# Patient Record
Sex: Female | Born: 1975 | Race: Black or African American | Hispanic: No | Smoking: Current some day smoker
Health system: Southern US, Community
[De-identification: ages and names within clinical notes are randomized; demographics above are authoritative.]

## PROBLEM LIST (undated history)

## (undated) DIAGNOSIS — J45909 Unspecified asthma, uncomplicated: Secondary | ICD-10-CM

## (undated) HISTORY — PX: TONSILLECTOMY: SUR1361

## (undated) HISTORY — PX: TUBAL LIGATION: SHX77

---

## 2017-02-20 ENCOUNTER — Emergency Department (HOSPITAL_COMMUNITY): Payer: Self-pay

## 2017-02-20 ENCOUNTER — Encounter (HOSPITAL_COMMUNITY): Payer: Self-pay

## 2017-02-20 ENCOUNTER — Emergency Department (HOSPITAL_COMMUNITY)
Admission: EM | Admit: 2017-02-20 | Discharge: 2017-02-20 | Disposition: A | Discharge status: Home / Self Care | Payer: Self-pay | Attending: Emergency Medicine | Admitting: Emergency Medicine

## 2017-02-20 ENCOUNTER — Other Ambulatory Visit: Payer: Self-pay

## 2017-02-20 DIAGNOSIS — R079 Chest pain, unspecified: Secondary | ICD-10-CM | POA: Insufficient documentation

## 2017-02-20 DIAGNOSIS — J45909 Unspecified asthma, uncomplicated: Secondary | ICD-10-CM | POA: Insufficient documentation

## 2017-02-20 DIAGNOSIS — F17210 Nicotine dependence, cigarettes, uncomplicated: Secondary | ICD-10-CM | POA: Insufficient documentation

## 2017-02-20 DIAGNOSIS — Z79899 Other long term (current) drug therapy: Secondary | ICD-10-CM | POA: Insufficient documentation

## 2017-02-20 DIAGNOSIS — R0602 Shortness of breath: Secondary | ICD-10-CM | POA: Insufficient documentation

## 2017-02-20 HISTORY — DX: Unspecified asthma, uncomplicated: J45.909

## 2017-02-20 LAB — I-STAT TROPONIN, ED
TROPONIN I, POC: 0.01 ng/mL (ref 0.00–0.08)
Troponin i, poc: 0 ng/mL (ref 0.00–0.08)

## 2017-02-20 LAB — CBC
HCT: 40.5 % (ref 36.0–46.0)
Hemoglobin: 13.3 g/dL (ref 12.0–15.0)
MCH: 30 pg (ref 26.0–34.0)
MCHC: 32.8 g/dL (ref 30.0–36.0)
MCV: 91.4 fL (ref 78.0–100.0)
PLATELETS: 328 10*3/uL (ref 150–400)
RBC: 4.43 MIL/uL (ref 3.87–5.11)
RDW: 13.1 % (ref 11.5–15.5)
WBC: 8.2 10*3/uL (ref 4.0–10.5)

## 2017-02-20 LAB — BASIC METABOLIC PANEL
Anion gap: 8 (ref 5–15)
BUN: 8 mg/dL (ref 6–20)
CALCIUM: 9.2 mg/dL (ref 8.9–10.3)
CO2: 24 mmol/L (ref 22–32)
CREATININE: 0.8 mg/dL (ref 0.44–1.00)
Chloride: 106 mmol/L (ref 101–111)
Glucose, Bld: 97 mg/dL (ref 65–99)
Potassium: 4 mmol/L (ref 3.5–5.1)
Sodium: 138 mmol/L (ref 135–145)

## 2017-02-20 LAB — I-STAT BETA HCG BLOOD, ED (MC, WL, AP ONLY): I-stat hCG, quantitative: <5 m[IU]/mL (ref <5)

## 2017-02-20 LAB — D-DIMER, QUANTITATIVE: D-Dimer, Quant: 0.66 ug/mL-FEU — ABNORMAL HIGH (ref 0.00–0.50)

## 2017-02-20 LAB — BRAIN NATRIURETIC PEPTIDE: B NATRIURETIC PEPTIDE 5: 18.3 pg/mL (ref 0.0–100.0)

## 2017-02-20 MED ORDER — IOPAMIDOL (ISOVUE-370) INJECTION 76%
INTRAVENOUS | Status: AC
  Administered 2017-02-20: 100 mL
  Filled 2017-02-20: qty 100

## 2017-02-20 MED ORDER — CYCLOBENZAPRINE HCL 10 MG PO TABS: 10.0000 mg | ORAL_TABLET | Freq: Two times a day (BID) | ORAL | 0 refills | Status: DC | PRN

## 2017-02-20 MED ORDER — ASPIRIN 81 MG PO CHEW
324.0000 mg | CHEWABLE_TABLET | Freq: Once | ORAL | Status: AC
  Administered 2017-02-20: 324 mg via ORAL
  Filled 2017-02-20: qty 4

## 2017-02-20 MED ORDER — NITROGLYCERIN 0.4 MG SL SUBL
0.4000 mg | SUBLINGUAL_TABLET | SUBLINGUAL | Status: DC | PRN
  Administered 2017-02-20: 0.4 mg via SUBLINGUAL
  Filled 2017-02-20: qty 1

## 2017-02-20 NOTE — ED Triage Notes (Signed)
Per Pt, Pt is coming from home with complaints of central chest pain that radiates to her back and neck. Denies SOB or N/V. Reports it started two days ago and has continued.

## 2017-02-20 NOTE — ED Notes (Signed)
Pt ambulatory to restroom

## 2017-02-21 NOTE — ED Provider Notes (Signed)
[facility] DEPT Provider Note   CSN: [id number] Arrival date & time: 02/20/17  1017     History   Chief Complaint Chief Complaint  Patient presents with  . Chest Pain    HPI Tamara Cline is a 41 y.o. female.  HPI   41yo female presents with concern for chest pain. Has been going on for the past 3 days. Chest pain is sharp, left sided, radiates to the back. It is worse with deep breaths, arm movements and laying down flat.  Reports she has had dyspnea over the last few days. No nausea, no vomiting, no diaphoresis.  No cough, no fever, no asymmetric leg swelling. Does not see doctor, but has not had diagnosis of htn, hlpd, DM. No fam hx of PE/CAD. No recent surgery. Does smoke cigarettes. Denies other drug use.  Past Medical History:  Diagnosis Date  . Asthma     There are no active problems to display for this patient.   Past Surgical History:  Procedure Laterality Date  . TONSILLECTOMY    . TUBAL LIGATION      OB History    No data available       Home Medications    Prior to Admission medications   Medication Sig Start Date End Date Taking? Authorizing Provider  esomeprazole (NEXIUM) 40 MG capsule Take 40 mg by mouth daily at 12 noon.   Yes [provider]  cyclobenzaprine (FLEXERIL) 10 MG tablet Take 1 tablet (10 mg total) by mouth 2 (two) times daily as needed for muscle spasms. 02/20/17   Gareth Morgan, MD    Family History No family history on file.  Social History Social History  Substance Use Topics  . Smoking status: Current Some Day Smoker    Types: Cigarettes  . Smokeless tobacco: Never Used  . Alcohol use Yes     Comment: Occasionally      Allergies   Patient has no known allergies.   Review of Systems Review of Systems  Constitutional: Negative for fever.  HENT: Negative for sore throat.   Eyes: Negative for visual disturbance.  Respiratory: Positive for shortness of breath. Negative for cough.   Cardiovascular:  Positive for chest pain.  Gastrointestinal: Negative for abdominal pain, diarrhea, nausea and vomiting.  Genitourinary: Negative for difficulty urinating.  Musculoskeletal: Negative for back pain and neck pain.  Skin: Negative for rash.  Neurological: Negative for syncope and headaches.     Physical Exam Updated Vital Signs BP 120/89   Pulse 73   Temp 98.7 F (37.1 C) (Oral)   Resp (!) 29   Ht 5\' 3"  (1.6 m)   Wt 117.9 kg (260 lb)   LMP 02/15/2017 (Exact Date)   SpO2 100%   BMI 46.06 kg/m   Physical Exam  Constitutional: She is oriented to person, place, and time. She appears well-developed and well-nourished. No distress.  HENT:  Head: Normocephalic and atraumatic.  Eyes: Conjunctivae and EOM are normal.  Neck: Normal range of motion.  Cardiovascular: Normal rate, regular rhythm, normal heart sounds and intact distal pulses.  Exam reveals no gallop and no friction rub.   No murmur heard. Pulmonary/Chest: Effort normal and breath sounds normal. No respiratory distress. She has no wheezes. She has no rales.  Abdominal: Soft. She exhibits no distension. There is no tenderness. There is no guarding.  Musculoskeletal: She exhibits no edema or tenderness.  Neurological: She is alert and oriented to person, place, and time.  Skin: Skin is warm and  dry. No rash noted. She is not diaphoretic. No erythema.  Nursing note and vitals reviewed.    ED Treatments / Results  Labs (all labs ordered are listed, but only abnormal results are displayed) Labs Reviewed  D-DIMER, QUANTITATIVE (NOT AT [facility]) - Abnormal; Notable for the following:       Result Value   D-Dimer, Quant 0.66 (*)    All other components within normal limits  BASIC METABOLIC PANEL  CBC  BRAIN NATRIURETIC PEPTIDE  I-STAT TROPOININ, ED  I-STAT BETA HCG BLOOD, ED ([facility], [facility], [facility] ONLY)  I-STAT TROPOININ, ED    EKG  EKG Interpretation  Date/Time:  Tuesday February 20 2017 10:21:43 EDT Ventricular Rate:  93 PR  Interval:  144 QRS Duration: 84 QT Interval:  368 QTC Calculation: 457 R Axis:   47 Text Interpretation:  Normal sinus rhythm Normal ECG Nonspecific TW changes No previous ECGs available Confirmed by Gareth Morgan [phone]) on 02/20/2017 12:41:40 PM       Radiology Dg Chest 2 View  Result Date: 02/20/2017 CLINICAL DATA:  Chest pain and cough EXAM: CHEST  2 VIEW COMPARISON:  None. FINDINGS: Lungs are clear. Heart size and pulmonary vascularity are normal. No adenopathy. No evident bone lesions. No pneumothorax. IMPRESSION: No edema or consolidation. Electronically Signed   By: Lowella Grip III M.D.   On: 02/20/2017 11:11   Ct Angio Chest Pe W And/or Wo Contrast  Result Date: 02/20/2017 CLINICAL DATA:  Mid chest pain for 3 days EXAM: CT ANGIOGRAPHY CHEST WITH CONTRAST TECHNIQUE: Multidetector CT imaging of the chest was performed using the standard protocol during bolus administration of intravenous contrast. Multiplanar CT image reconstructions and MIPs were obtained to evaluate the vascular anatomy. CONTRAST:  100 mL Isovue 370 COMPARISON:  None. FINDINGS: Cardiovascular: Satisfactory opacification of the pulmonary arteries to the segmental level. No evidence of pulmonary embolism. Normal heart size. No pericardial effusion. Thoracic aorta is normal in caliber. No thoracic aortic dissection. Mediastinum/Nodes: No enlarged mediastinal, hilar, or axillary lymph nodes. Thyroid gland, trachea, and esophagus demonstrate no significant findings. Lungs/Pleura: Lungs are clear. No pleural effusion or pneumothorax. Upper Abdomen: No acute abnormality. Musculoskeletal: No chest wall abnormality. No acute or significant osseous findings. Review of the MIP images confirms the above findings. IMPRESSION: 1. No pulmonary embolus. 2. No thoracic aortic aneurysm or dissection. 3. No acute cardiopulmonary disease. Electronically Signed   By: Kathreen Devoid   On: 02/20/2017 15:22    Procedures Procedures  (including critical care time)  Medications Ordered in ED Medications  iopamidol (ISOVUE-370) 76 % injection (100 mLs  Contrast Given 02/20/17 1511)  aspirin chewable tablet 324 mg (324 mg Oral Given 02/20/17 1530)     Initial Impression / Assessment and Plan / ED Course  I have reviewed the triage vital signs and the nursing notes.  Pertinent labs & imaging results that were available during my care of the patient were reviewed by me and considered in my medical decision making (see chart for details).     41yo female with history of smoking and asthma presents with concern for chest pain.  Differential diagnosis for chest pain includes pulmonary embolus, dissection, pneumothorax, pneumonia, ACS, myocarditis, pericarditis.  EKG was done and evaluate by me and showed no acute ST changes and no signs of pericarditis. Chest x-ray was done and evaluated by me and radiology and showed no sign of pneumonia or pneumothorax. DDimer pos, CTA PE study ordered showing no abnormalities. Patient is low risk HEART score  and had delta troponins which were both negative. Given this evaluation, history and physical have low suspicion for pulmonary embolus, pneumonia, ACS, myocarditis, pericarditis, dissection.   Patient most likely with musculoskeletal chest pain given pain with palpation as well as her movements. Given rx for flexeril for pain in the ED. Placed order for case management to follow up and assist in pt finding PCP. Patient discharged in stable condition with understanding of reasons to return and recommend PCP follow up.   Final Clinical Impressions(s) / ED Diagnoses   Final diagnoses:  Chest pain, unspecified type    New Prescriptions Discharge Medication List as of 02/20/2017  4:26 PM    START taking these medications   Details  cyclobenzaprine (FLEXERIL) 10 MG tablet Take 1 tablet (10 mg total) by mouth 2 (two) times daily as needed for muscle spasms., Starting Tue 02/20/2017, Print           Gareth Morgan, MD 02/21/17 1326

## 2018-07-15 ENCOUNTER — Ambulatory Visit (HOSPITAL_COMMUNITY)
Admission: EM | Admit: 2018-07-15 | Discharge: 2018-07-15 | Disposition: A | Discharge status: Home / Self Care | Payer: Self-pay | Attending: Family Medicine | Admitting: Family Medicine

## 2018-07-15 ENCOUNTER — Ambulatory Visit (INDEPENDENT_AMBULATORY_CARE_PROVIDER_SITE_OTHER): Payer: Self-pay

## 2018-07-15 ENCOUNTER — Encounter (HOSPITAL_COMMUNITY): Payer: Self-pay

## 2018-07-15 ENCOUNTER — Other Ambulatory Visit: Payer: Self-pay

## 2018-07-15 DIAGNOSIS — M25571 Pain in right ankle and joints of right foot: Secondary | ICD-10-CM

## 2018-07-15 DIAGNOSIS — M79671 Pain in right foot: Secondary | ICD-10-CM

## 2018-07-15 MED ORDER — NAPROXEN 500 MG PO TABS: 500.0000 mg | ORAL_TABLET | Freq: Two times a day (BID) | ORAL | 0 refills | Status: DC

## 2018-07-15 NOTE — ED Provider Notes (Signed)
[facility]   [id number] 07/15/18 Arrival Time: 8250  CC: RT foot pain  SUBJECTIVE: History from: patient. Tamara Cline is a 42 y.o. female complains of right ankle and foot pain that began a couple days ago.  Symtpoms began after she slipped and fell walking down a steep hill.  Is unsure of the mechanism of her fall, does not recall hitting her foot or twisting her ankle.  Localizes the pain to the front of ankle and top of foot.  Describes the pain as constant and sharp, dull, and achy in character.  Has NOT tried OTC medications.  Symptoms are made worse with weight-bearing and walking.  Reports previous fracture in 2008.  Complains of associated numbness and tingling in toes.  Denies fever, chills, erythema, ecchymosis, effusion, or weakness.  ROS: As per HPI.  Past Medical History:  Diagnosis Date  . Asthma    Past Surgical History:  Procedure Laterality Date  . TONSILLECTOMY    . TUBAL LIGATION     No Known Allergies No current facility-administered medications on file prior to encounter.    Current Outpatient Medications on File Prior to Encounter  Medication Sig Dispense Refill  . esomeprazole (NEXIUM) 40 MG capsule Take 40 mg by mouth daily at 12 noon.     Social History   Socioeconomic History  . Marital status: Divorced    Spouse name: Not on file  . Number of children: Not on file  . Years of education: Not on file  . Highest education level: Not on file  Occupational History  . Not on file  Social Needs  . Financial resource strain: Not on file  . Food insecurity:    Worry: Not on file    Inability: Not on file  . Transportation needs:    Medical: Not on file    Non-medical: Not on file  Tobacco Use  . Smoking status: Current Some Day Smoker    Types: Cigarettes  . Smokeless tobacco: Never Used  Substance and Sexual Activity  . Alcohol use: Yes    Comment: Occasionally   . Drug use: Yes    Types: Cocaine    Comment: Reports using  Cocaine 3 days ago  . Sexual activity: Not on file  Lifestyle  . Physical activity:    Days per week: Not on file    Minutes per session: Not on file  . Stress: Not on file  Relationships  . Social connections:    Talks on phone: Not on file    Gets together: Not on file    Attends religious service: Not on file    Active member of club or organization: Not on file    Attends meetings of clubs or organizations: Not on file    Relationship status: Not on file  . Intimate partner violence:    Fear of current or ex partner: Not on file    Emotionally abused: Not on file    Physically abused: Not on file    Forced sexual activity: Not on file  Other Topics Concern  . Not on file  Social History Narrative  . Not on file   History reviewed. No pertinent family history.  OBJECTIVE:  Vitals:   07/15/18 1609 07/15/18 1612  BP:  138/81  Resp:  18  Temp:  98.2 F (36.8 C)  SpO2:  100%  Weight: (!) 310 lb (140.6 kg)     General appearance: Alert; in no acute distress.  Head: NCAT Lungs:  CTA bilaterally Heart: RRR.  Clear S1 and S2 without murmur, gallops, or rubs.  Radial pulses 2+ bilaterally. Musculoskeletal: RT foot/ ankle Inspection: Skin warm, dry, clear and intact without obvious erythema, or ecchymosis. Mild swelling about the ankle Palpation: Diffusely tender about the anterior ankle, and dorsal aspect of the metatarsals ROM: FROM active and passive Strength: 5/5 dorsiflexion, 5/5 plantar flexion Skin: warm and dry Neurologic: Ambulates with mild difficulty; Sensation intact about the upper/ lower extremities Psychological: alert and cooperative; normal mood and affect  DIAGNOSTIC STUDIES:  Dg Ankle Complete Right  Result Date: 07/15/2018 CLINICAL DATA:  Fall with proximal foot pain EXAM: RIGHT ANKLE - COMPLETE 3+ VIEW COMPARISON:  None. FINDINGS: No acute displaced fracture or malalignment. Small plantar calcaneal spur. Ankle mortise is symmetric. IMPRESSION: No  acute osseous abnormality. Electronically Signed   By: Donavan Foil M.D.   On: 07/15/2018 18:13   Dg Foot Complete Right  Result Date: 07/15/2018 CLINICAL DATA:  Fall with foot pain EXAM: RIGHT FOOT COMPLETE - 3+ VIEW COMPARISON:  None. FINDINGS: No fracture or malalignment.  Small plantar calcaneal spur. IMPRESSION: No acute osseous abnormality. Electronically Signed   By: Donavan Foil M.D.   On: 07/15/2018 18:14   ASSESSMENT & PLAN:  1. Right foot pain   2. Acute right ankle pain     Meds ordered this encounter  Medications  . naproxen (NAPROSYN) 500 MG tablet    Sig: Take 1 tablet (500 mg total) by mouth 2 (two) times daily.    Dispense:  30 tablet    Refill:  0    Order Specific Question:   Supervising Provider    Answer:   Raylene Everts [[id number]]   X-rays did not show fracture or dislocation Pt would like to try cam walker Symptoms most likely related to musculoskeletal injury Continue conservative management of rest, ice, elevation, and gentle stretches Take naproxen as needed for pain relief (may cause abdominal discomfort, ulcers, and GI bleeds avoid taking with other NSAIDs) Follow up with orthopedist if symptoms persist Return or go to the ER if you have any new or worsening symptoms (fever, chills, chest pain, abdominal pain, changes in bowel or bladder habits, pain radiating into lower legs, etc...)   Reviewed expectations re: course of current medical issues. Questions answered. Outlined signs and symptoms indicating need for more acute intervention. Patient verbalized understanding. After Visit Summary given.    Lestine Box, PA-C 07/15/18 5621

## 2018-07-15 NOTE — ED Triage Notes (Signed)
Pt cc right foot pain she broke it 2008 and but Saturday she fell going  down a hill.

## 2018-07-15 NOTE — Discharge Instructions (Signed)
X-rays did not show fracture or dislocation Symptoms most likely related to musculoskeletal injury Continue conservative management of rest, ice, elevation, and gentle stretches Take naproxen as needed for pain relief (may cause abdominal discomfort, ulcers, and GI bleeds avoid taking with other NSAIDs) Follow up with orthopedist if symptoms persist Return or go to the ER if you have any new or worsening symptoms (fever, chills, chest pain, abdominal pain, changes in bowel or bladder habits, pain radiating into lower legs, etc...)

## 2018-11-01 ENCOUNTER — Other Ambulatory Visit: Payer: Self-pay

## 2018-11-01 ENCOUNTER — Ambulatory Visit (HOSPITAL_COMMUNITY)
Admission: EM | Admit: 2018-11-01 | Discharge: 2018-11-01 | Disposition: A | Discharge status: Home / Self Care | Payer: Self-pay | Attending: Emergency Medicine | Admitting: Emergency Medicine

## 2018-11-01 ENCOUNTER — Encounter (HOSPITAL_COMMUNITY): Payer: Self-pay | Admitting: Emergency Medicine

## 2018-11-01 DIAGNOSIS — M79671 Pain in right foot: Secondary | ICD-10-CM

## 2018-11-01 DIAGNOSIS — M722 Plantar fascial fibromatosis: Secondary | ICD-10-CM

## 2018-11-01 MED ORDER — KETOROLAC TROMETHAMINE 60 MG/2ML IM SOLN
INTRAMUSCULAR | Status: AC
  Filled 2018-11-01: qty 2

## 2018-11-01 MED ORDER — MELOXICAM 7.5 MG PO TABS: 7.5000 mg | ORAL_TABLET | Freq: Every day | ORAL | 0 refills | Status: DC

## 2018-11-01 MED ORDER — KETOROLAC TROMETHAMINE 60 MG/2ML IM SOLN
60.0000 mg | Freq: Once | INTRAMUSCULAR | Status: AC
  Administered 2018-11-01: 60 mg via INTRAMUSCULAR

## 2018-11-01 NOTE — Discharge Instructions (Signed)
Ice, elevation, daily meloxicam to help with pain. Start tomorrow.  May use the shoe we have provided but ultimately you need a strong arch support and heel support shoe. Stretching of the foot with a tennis ball can be helpful.  I would recommend following up with podiatry as you may need physical therapy, splinting, or further treatments if symptoms persist.

## 2018-11-01 NOTE — ED Triage Notes (Signed)
Pt reports right heel pain near her arch that started yesterday after she was running on the sidewalk trying to catch the bus.  Pt states when she woke up this morning, she could not even walk to the bathroom.  She reports that she has pain everyday while standing at work, but this pain is worse than usual.

## 2018-11-01 NOTE — ED Provider Notes (Signed)
[facility]    CSN: [id number] Arrival date & time: 11/01/18  1519     History   Chief Complaint Chief Complaint  Patient presents with  . Foot Pain    right    HPI Tamara Cline is a 43 y.o. female.   [name] presents today with complaints of 10/10 pain to bottom of right foot. 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 Has had pain for a long time but worse yesterday. Usually improves once she is off of her feet. She wears Dr. Zoe Lan shoes with insoles for support. This morning when she woke she had to crawl to the restroom due to the severity of the pain. No specific injury. Hasn't taken any medications for symptoms. Pain even at rest but worse with weight bearing and with toe extension. Has had a fibular fracture in the past. No obvious redness or swelling but feels swollen. Hx of asthma.     ROS per HPI, negative if not otherwise mentioned.      Past Medical History:  Diagnosis Date  . Asthma     There are no active problems to display for this patient.   Past Surgical History:  Procedure Laterality Date  . TONSILLECTOMY    . TUBAL LIGATION      OB History   No obstetric history on file.      Home Medications    Prior to Admission medications   Medication Sig Start Date End Date Taking? Authorizing Provider  esomeprazole (NEXIUM) 40 MG capsule Take 40 mg by mouth daily at 12 noon.    [provider]  meloxicam (MOBIC) 7.5 MG tablet Take 1 tablet (7.5 mg total) by mouth daily. 11/01/18   Zigmund Gottron, NP    Family History History reviewed. No pertinent family history.  Social History Social History   Tobacco Use  . Smoking status: Current Some Day Smoker    Types: Cigarettes  . Smokeless tobacco: Never Used  Substance Use Topics  . Alcohol use: Yes    Comment: Occasionally   . Drug use: Yes    Types: Cocaine    Comment: Reports using Cocaine 3 days ago     Allergies   Patient has no known allergies.    Review of Systems Review of Systems   Physical Exam Triage Vital Signs ED Triage Vitals  Enc Vitals Group     BP 11/01/18 1556 (!) 146/97     Pulse Rate 11/01/18 1556 82     Resp --      Temp 11/01/18 1556 98 F (36.7 C)     Temp Source 11/01/18 1556 Oral     SpO2 11/01/18 1556 98 %     Weight --      Height --      Head Circumference --      Peak Flow --      Pain Score 11/01/18 1554 10     Pain Loc --      Pain Edu? --      Excl. in [place]? --    No data found.  Updated Vital Signs BP (!) 146/97 (BP Location: Left Wrist)   Pulse 82   Temp 98 F (36.7 C) (Oral)   LMP 11/01/2018 (Exact Date)   SpO2 98%    Physical Exam Constitutional:      General: She is not in acute distress.    Appearance: She is well-developed.  Cardiovascular:     Rate  and Rhythm: Normal rate and regular rhythm.     Heart sounds: Normal heart sounds.  Pulmonary:     Effort: Pulmonary effort is normal.     Breath sounds: Normal breath sounds.  Musculoskeletal:     Right ankle: Normal.     Right foot: Normal range of motion and normal capillary refill. Tenderness and bony tenderness present. No swelling, crepitus, deformity or laceration.       Feet:     Comments: Tenderness to plantar fascia with palpation, worse with palpation and dorsiflexion; strong pedal pulse; cap refill < 2 seconds    Skin:    General: Skin is warm and dry.  Neurological:     Mental Status: She is alert and oriented to person, place, and time.      UC Treatments / Results  Labs (all labs ordered are listed, but only abnormal results are displayed) Labs Reviewed - No data to display  EKG None  Radiology No results found.  Procedures Procedures (including critical care time)  Medications Ordered in UC Medications  ketorolac (TORADOL) injection 60 mg (60 mg Intramuscular Given 11/01/18 1659)    Initial Impression / Assessment and Plan / UC Course  I have reviewed the triage vital signs and the nursing  notes.  Pertinent labs & imaging results that were available during my care of the patient were reviewed by me and considered in my medical decision making (see chart for details).     Consistent with plantar fasciitis. Patient tearful due to pain. Supportive cares recommended and discussed, encouraged follow up with podiatry. Patient verbalized understanding and agreeable to plan.   Final Clinical Impressions(s) / UC Diagnoses   Final diagnoses:  Plantar fasciitis of right foot     Discharge Instructions     Ice, elevation, daily meloxicam to help with pain. Start tomorrow.  May use the shoe we have provided but ultimately you need a strong arch support and heel support shoe. Stretching of the foot with a tennis ball can be helpful.  I would recommend following up with podiatry as you may need physical therapy, splinting, or further treatments if symptoms persist.      ED Prescriptions    Medication Sig Dispense Auth. Provider   meloxicam (MOBIC) 7.5 MG tablet Take 1 tablet (7.5 mg total) by mouth daily. 20 tablet Zigmund Gottron, NP     Controlled Substance Prescriptions [place] Controlled Substance Registry consulted? Not Applicable   Zigmund Gottron, NP 11/01/18 1705

## 2019-07-17 ENCOUNTER — Encounter (HOSPITAL_COMMUNITY): Payer: Self-pay | Admitting: Emergency Medicine

## 2019-07-17 ENCOUNTER — Ambulatory Visit (HOSPITAL_COMMUNITY)
Admission: EM | Admit: 2019-07-17 | Discharge: 2019-07-17 | Disposition: A | Discharge status: Home / Self Care | Payer: Self-pay | Attending: Family Medicine | Admitting: Family Medicine

## 2019-07-17 ENCOUNTER — Other Ambulatory Visit: Payer: Self-pay

## 2019-07-17 DIAGNOSIS — B9789 Other viral agents as the cause of diseases classified elsewhere: Secondary | ICD-10-CM

## 2019-07-17 DIAGNOSIS — J45909 Unspecified asthma, uncomplicated: Secondary | ICD-10-CM | POA: Insufficient documentation

## 2019-07-17 DIAGNOSIS — R05 Cough: Secondary | ICD-10-CM

## 2019-07-17 DIAGNOSIS — Z20828 Contact with and (suspected) exposure to other viral communicable diseases: Secondary | ICD-10-CM | POA: Insufficient documentation

## 2019-07-17 DIAGNOSIS — J069 Acute upper respiratory infection, unspecified: Secondary | ICD-10-CM | POA: Insufficient documentation

## 2019-07-17 DIAGNOSIS — F17210 Nicotine dependence, cigarettes, uncomplicated: Secondary | ICD-10-CM | POA: Insufficient documentation

## 2019-07-17 DIAGNOSIS — J029 Acute pharyngitis, unspecified: Secondary | ICD-10-CM

## 2019-07-17 MED ORDER — BENZONATATE 100 MG PO CAPS: 100.0000 mg | ORAL_CAPSULE | Freq: Three times a day (TID) | ORAL | 0 refills | Status: DC

## 2019-07-17 NOTE — Discharge Instructions (Addendum)
If your Covid-19 test is positive, you will receive a phone call from [facility] regarding your results. Negative test results are not called. Both positive and negative results area always visible on MyChart. If you do not have a MyChart account, sign up instructions are in your discharge papers.  

## 2019-07-17 NOTE — ED Triage Notes (Signed)
Pt c/o cold sx onset 3 days associated w/nasal drainage, chest discomfort d/t cough, SOB, dyspnea  Sent home from work   Denies fevers  A&O x4... NAD.Marland Kitchen. ambulatory

## 2019-07-19 LAB — NOVEL CORONAVIRUS, NAA (HOSP ORDER, SEND-OUT TO REF LAB; TAT 18-24 HRS): SARS-CoV-2, NAA: NOT DETECTED

## 2019-07-21 ENCOUNTER — Telehealth (HOSPITAL_COMMUNITY): Payer: Self-pay | Admitting: Emergency Medicine

## 2019-07-21 NOTE — Telephone Encounter (Signed)
Verified patient with 2 identifiers.  Reported test results as negative.  Instructed patient that she can obtain a copy of results from her my chart account.  Otherwise, patient can come to [facility] during open hours and get a copy of lab results.  Patient states understanding

## 2019-07-22 NOTE — ED Provider Notes (Signed)
[place]   [id number] 07/17/19 Arrival Time: [id number]  ASSESSMENT & PLAN:  1. Viral URI with cough     COVID testing sent. To self-quarantine until results are available and until she is feeling better. No indication for chest imaging at this time.  Meds ordered this encounter  Medications  . benzonatate (TESSALON) 100 MG capsule    Sig: Take 1 capsule (100 mg total) by mouth every 8 (eight) hours.    Dispense:  21 capsule    Refill:  0    Discussed typical duration of symptoms. OTC symptom care as needed. Ensure adequate fluid intake and rest. May f/u with PCP or here as needed.  Reviewed expectations re: course of current medical issues. Questions answered. Outlined signs and symptoms indicating need for more acute intervention. Patient verbalized understanding. After Visit Summary given.   SUBJECTIVE: History from: patient.  Tamara Cline is a 43 y.o. female who presents with complaint of mild nasal congestion and dry cough; without sore throat. Onset abrupt, about 3 days ago; without fatigue and without body aches. SOB: questions mild when coughing; none at rest. Wheezing: none. Fever: absent. Overall normal PO intake without n/v. Known sick contacts or COVID-19 exposure: no. No specific or significant aggravating or alleviating factors reported. OTC treatment: none reported.  Received flu shot this year: no.  Social History   Tobacco Use  Smoking Status Current Some Day Smoker  . Types: Cigarettes  Smokeless Tobacco Never Used    ROS: As per HPI.   OBJECTIVE:  Vitals:   07/17/19 1741  BP: 134/69  Pulse: 86  Resp: 20  Temp: 98.5 F (36.9 C)  TempSrc: Oral  SpO2: 98%     General appearance: alert; appears fatigued HEENT: nasal congestion; clear runny nose; throat irritation secondary to post-nasal drainage Neck: supple without LAD CV: RRR Lungs: unlabored respirations, symmetrical air entry without wheezing; cough: mild, dry Abd:  soft Ext: no LE edema Skin: warm and dry Psychological: alert and cooperative; normal mood and affect    No Known Allergies  Past Medical History:  Diagnosis Date  . Asthma     Social History   Socioeconomic History  . Marital status: Divorced    Spouse name: Not on file  . Number of children: Not on file  . Years of education: Not on file  . Highest education level: Not on file  Occupational History  . Not on file  Tobacco Use  . Smoking status: Current Some Day Smoker    Types: Cigarettes  . Smokeless tobacco: Never Used  Substance and Sexual Activity  . Alcohol use: Yes    Comment: Occasionally   . Drug use: Yes    Types: Cocaine    Comment: Reports using Cocaine 3 days ago  . Sexual activity: Not on file  Other Topics Concern  . Not on file  Social History Narrative  . Not on file   Social Determinants of Health   Financial Resource Strain:   . Difficulty of Paying Living Expenses: Not on file  Food Insecurity:   . Worried About Charity fundraiser in the Last Year: Not on file  . Ran Out of Food in the Last Year: Not on file  Transportation Needs:   . Lack of Transportation (Medical): Not on file  . Lack of Transportation (Non-Medical): Not on file  Physical Activity:   . Days of Exercise per Week: Not on file  . Minutes of Exercise per Session: Not on  file  Stress:   . Feeling of Stress : Not on file  Social Connections:   . Frequency of Communication with Friends and Family: Not on file  . Frequency of Social Gatherings with Friends and Family: Not on file  . Attends Religious Services: Not on file  . Active Member of Clubs or Organizations: Not on file  . Attends Archivist Meetings: Not on file  . Marital Status: Not on file  Intimate Partner Violence:   . Fear of Current or Ex-Partner: Not on file  . Emotionally Abused: Not on file  . Physically Abused: Not on file  . Sexually Abused: Not on file           Vanessa Kick,  MD 07/22/19 [phone]

## 2020-01-15 ENCOUNTER — Other Ambulatory Visit: Payer: Self-pay

## 2020-01-15 ENCOUNTER — Ambulatory Visit (HOSPITAL_COMMUNITY)
Admission: EM | Admit: 2020-01-15 | Discharge: 2020-01-15 | Disposition: A | Discharge status: Home / Self Care | Payer: Self-pay | Attending: Physician Assistant | Admitting: Physician Assistant

## 2020-01-15 ENCOUNTER — Encounter (HOSPITAL_COMMUNITY): Payer: Self-pay

## 2020-01-15 DIAGNOSIS — R21 Rash and other nonspecific skin eruption: Secondary | ICD-10-CM

## 2020-01-15 MED ORDER — METHYLPREDNISOLONE SODIUM SUCC 125 MG IJ SOLR
INTRAMUSCULAR | Status: AC
  Filled 2020-01-15: qty 2

## 2020-01-15 MED ORDER — METHYLPREDNISOLONE SODIUM SUCC 125 MG IJ SOLR
125.0000 mg | Freq: Once | INTRAMUSCULAR | Status: AC
  Administered 2020-01-15: 125 mg via INTRAMUSCULAR

## 2020-01-15 MED ORDER — PREDNISONE 10 MG (21) PO TBPK: 40.0000 mg | ORAL_TABLET | Freq: Every day | ORAL | 0 refills | Status: AC

## 2020-01-15 NOTE — ED Triage Notes (Signed)
Pt is here with a rash that appeared Sunday, pt states it itches and hurts. This rash is all over, pt has taken Benadryl to relieve discomfort.

## 2020-01-15 NOTE — Discharge Instructions (Signed)
Continue benadryl at night  Take Over the counter zyrtec during the day  Use topical calamine lotion on areas that itch  If you can afford to fill the prednisone, do this and start taking this on Saturday. If you have significant improvement following treatment today, fill the prednisone  If no improvement over the weekend, return

## 2020-01-15 NOTE — ED Provider Notes (Signed)
[facility]    CSN: [id number] Arrival date & time: 01/15/20  1214      History   Chief Complaint Chief Complaint  Patient presents with  . Rash    HPI Tamara Cline is a 44 y.o. female.   Patient presents for evaluation of itchy rash.  She reports she first noticed the rash about 5 days ago.  She first noticed it on her right elbow pit.  Since then the rash is spread on her arm, to her neck, parts of her back and on her forehead.  She reports it is very itchy.  She reports some parts of become painful from itching.  She has tried Benadryl with some relief however this has not relieved the rash enough.  She has not tried anything topically.  She is unsure what she was exposed to.  Denies any garden work or weed pulling or exposure to any plants.  Denies any new lotions, soaps.  No one has a similar rash.  She has not had any known allergies.  Denies difficulty breathing.  Patient states she does not have much money to pay for medicines and is asking for treatment here as opposed to the pharmacy.  She states she is not from the area and does not have much help in the area.     Past Medical History:  Diagnosis Date  . Asthma     There are no problems to display for this patient.   Past Surgical History:  Procedure Laterality Date  . TONSILLECTOMY    . TUBAL LIGATION      OB History   No obstetric history on file.      Home Medications    Prior to Admission medications   Medication Sig Start Date End Date Taking? Authorizing Provider  benzonatate (TESSALON) 100 MG capsule Take 1 capsule (100 mg total) by mouth every 8 (eight) hours. 07/17/19   Vanessa Kick, MD  esomeprazole (NEXIUM) 40 MG capsule Take 40 mg by mouth daily at 12 noon.    [provider]  meloxicam (MOBIC) 7.5 MG tablet Take 1 tablet (7.5 mg total) by mouth daily. 11/01/18   Zigmund Gottron, NP  predniSONE (STERAPRED UNI-PAK 21 TAB) 10 MG (21) TBPK tablet Take 4 tablets (40 mg  total) by mouth daily for 5 days. 01/15/20 01/20/20  Less [place], Marguerita Beards, PA-C    Family History Family History  Problem Relation Age of Onset  . Diabetes Mother   . Hypertension Mother   . Diabetes Father   . Hypertension Father     Social History Social History   Tobacco Use  . Smoking status: Current Some Day Smoker    Types: Cigarettes  . Smokeless tobacco: Never Used  Substance Use Topics  . Alcohol use: Yes    Comment: Occasionally   . Drug use: Yes    Types: Cocaine    Comment: Reports using Cocaine 3 days ago      Allergies   Patient has no known allergies.   Review of Systems Review of Systems   Physical Exam Triage Vital Signs ED Triage Vitals  Enc Vitals Group     BP 01/15/20 1310 131/90     Pulse Rate 01/15/20 1310 99     Resp 01/15/20 1310 19     Temp 01/15/20 1310 98.7 F (37.1 C)     Temp Source 01/15/20 1310 Oral     SpO2 01/15/20 1310 99 %     Weight --  Height --      Head Circumference --      Peak Flow --      Pain Score 01/15/20 1306 5     Pain Loc --      Pain Edu? --      Excl. in [place]? --    No data found.  Updated Vital Signs BP 131/90 (BP Location: Right Arm)   Pulse 99   Temp 98.7 F (37.1 C) (Oral)   Resp 19   LMP 12/31/2019   SpO2 99%   Visual Acuity Right Eye Distance:   Left Eye Distance:   Bilateral Distance:    Right Eye Near:   Left Eye Near:    Bilateral Near:     Physical Exam Vitals and nursing note reviewed.  Constitutional:      General: She is not in acute distress.    Appearance: She is well-developed.  HENT:     Head: Normocephalic and atraumatic.  Eyes:     Conjunctiva/sclera: Conjunctivae normal.  Cardiovascular:     Rate and Rhythm: Normal rate and regular rhythm.     Heart sounds: No murmur heard.   Pulmonary:     Effort: Pulmonary effort is normal. No respiratory distress.     Breath sounds: Normal breath sounds.  Abdominal:     Palpations: Abdomen is soft.     Tenderness: There is  no abdominal tenderness.  Musculoskeletal:     Cervical back: Neck supple.  Skin:    General: Skin is warm and dry.     Findings: Rash (Erythematous, papular and occasionally vesicular rash on the right anterior elbow flexural surface, right forearm, left upper forehead and bilateral flanks.  There is some dermatographia.  No pustules) present.  Neurological:     Mental Status: She is alert.      UC Treatments / Results  Labs (all labs ordered are listed, but only abnormal results are displayed) Labs Reviewed - No data to display  EKG   Radiology No results found.  Procedures Procedures (including critical care time)  Medications Ordered in UC Medications  methylPREDNISolone sodium succinate (SOLU-MEDROL) 125 mg/2 mL injection 125 mg (125 mg Intramuscular Given 01/15/20 1354)    Initial Impression / Assessment and Plan / UC Course  I have reviewed the triage vital signs and the nursing notes.  Pertinent labs & imaging results that were available during my care of the patient were reviewed by me and considered in my medical decision making (see chart for details).     #Rash Patient is a 44 year old presenting with a rash that appears consistent with contact dermatitis.  Given patient has limited resources to pay for prescription medicines we will give her Solu-Medrol here with a prednisone prescription to be filled if possible.  Recommended continued use of Benadryl at night and to use Zyrtec during the day.  Return and follow-up precautions were discussed.  Patient verbalized understanding Final Clinical Impressions(s) / UC Diagnoses   Final diagnoses:  Rash and nonspecific skin eruption     Discharge Instructions     Continue benadryl at night  Take Over the counter zyrtec during the day  Use topical calamine lotion on areas that itch  If you can afford to fill the prednisone, do this and start taking this on Saturday. If you have significant improvement  following treatment today, fill the prednisone  If no improvement over the weekend, return      ED Prescriptions    Medication Sig  Dispense Auth. Provider   predniSONE (STERAPRED UNI-PAK 21 TAB) 10 MG (21) TBPK tablet Take 4 tablets (40 mg total) by mouth daily for 5 days. 20 tablet Davina Howlett, Marguerita Beards, PA-C     PDMP not reviewed this encounter.   Purnell Shoemaker, PA-C 01/15/20 2342

## 2020-04-20 IMAGING — DX DG FOOT COMPLETE 3+V*R*
3 series · 3 of 3 positions shown · non-contrast
Comparison: None.

CLINICAL DATA: Fall with foot pain

EXAM:
RIGHT FOOT COMPLETE - 3+ VIEW

[foot ap]
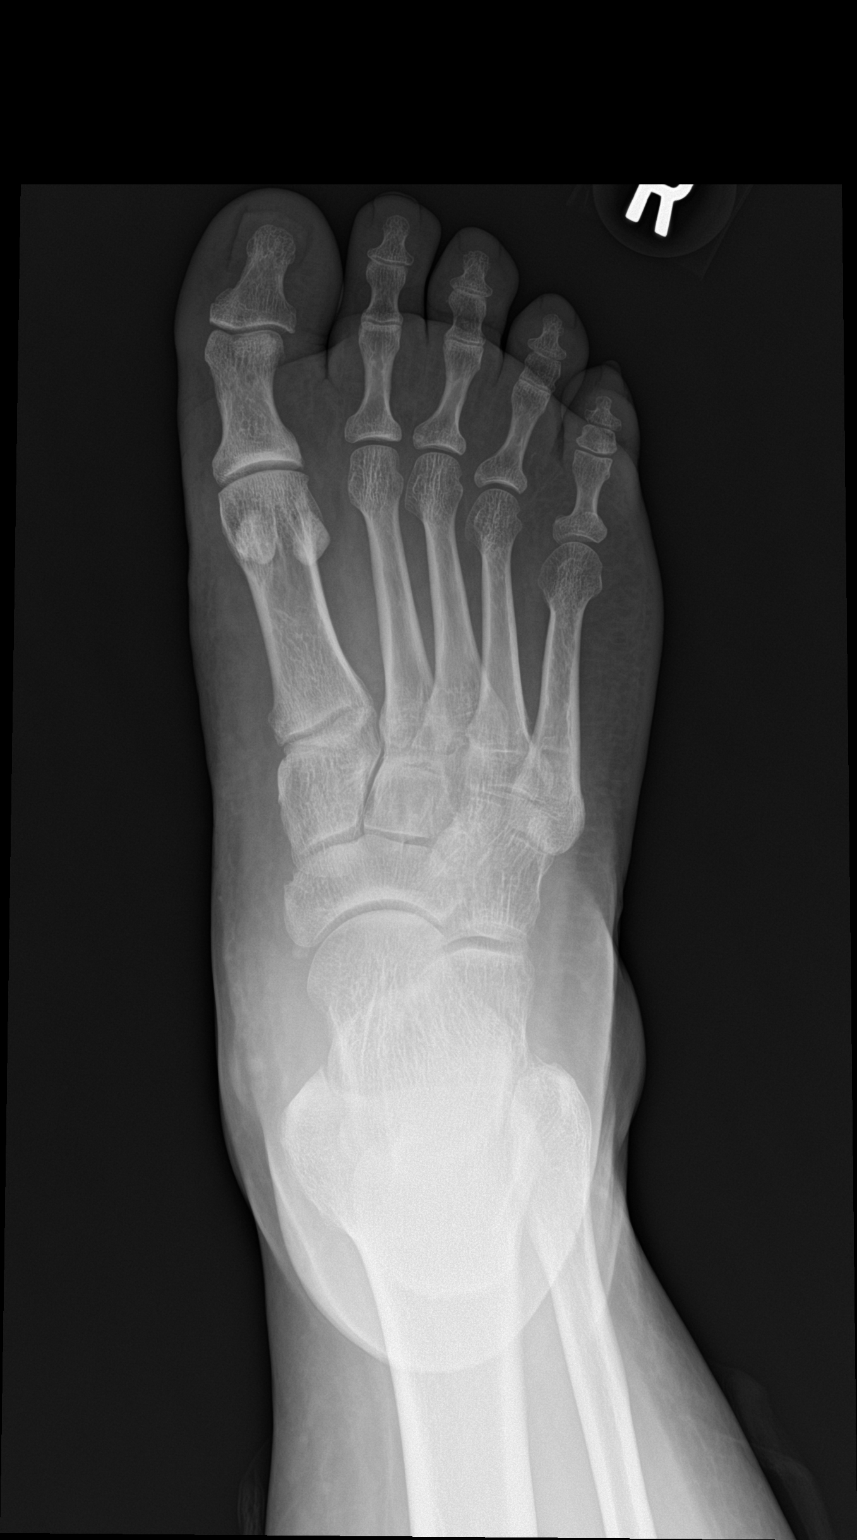

[foot obl]
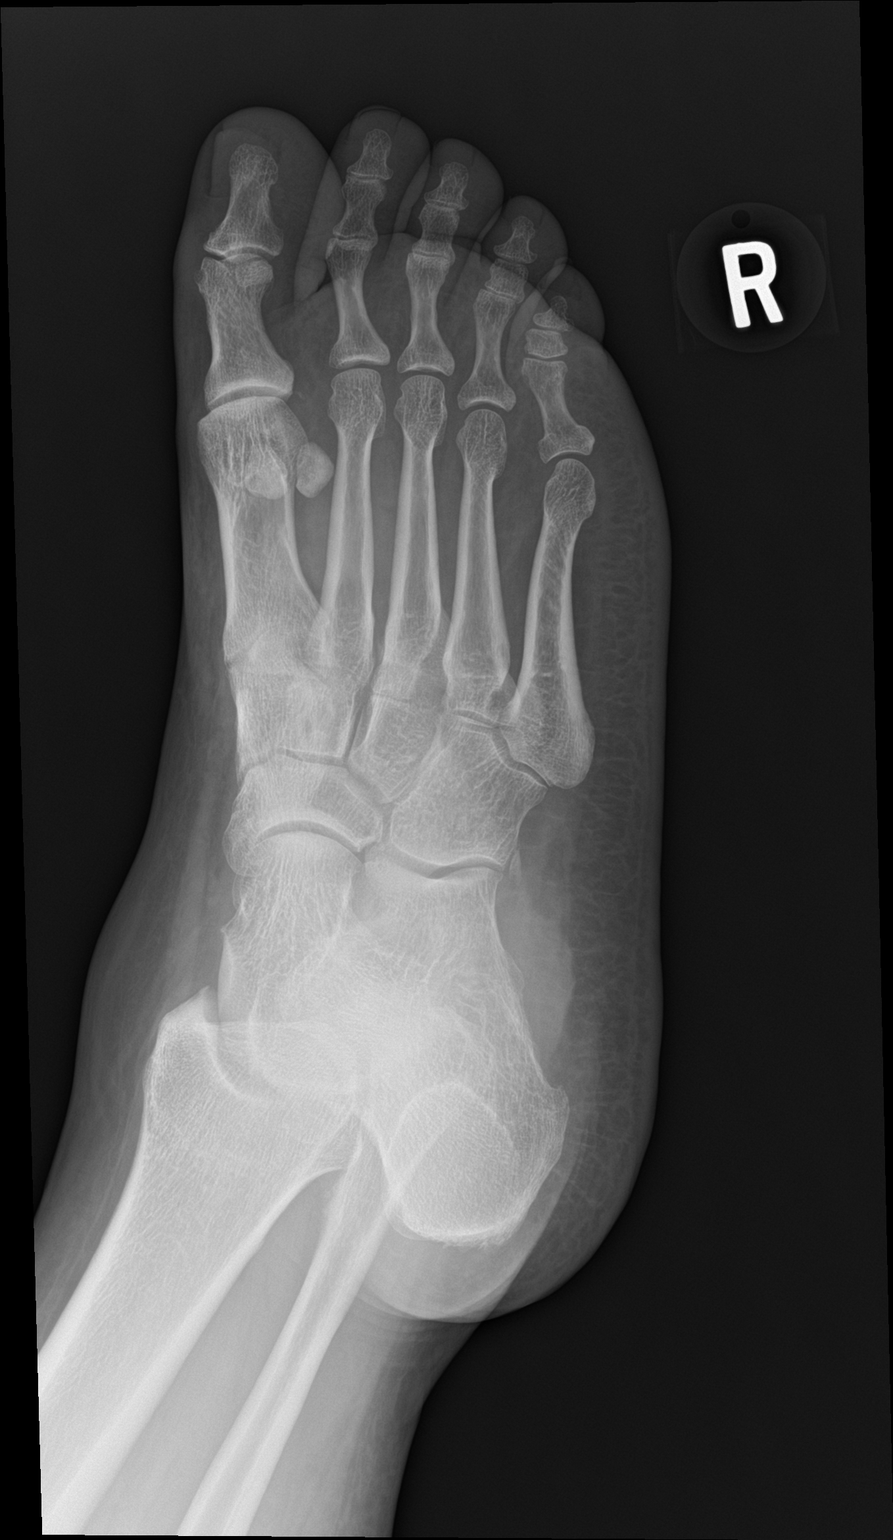

[foot lat]
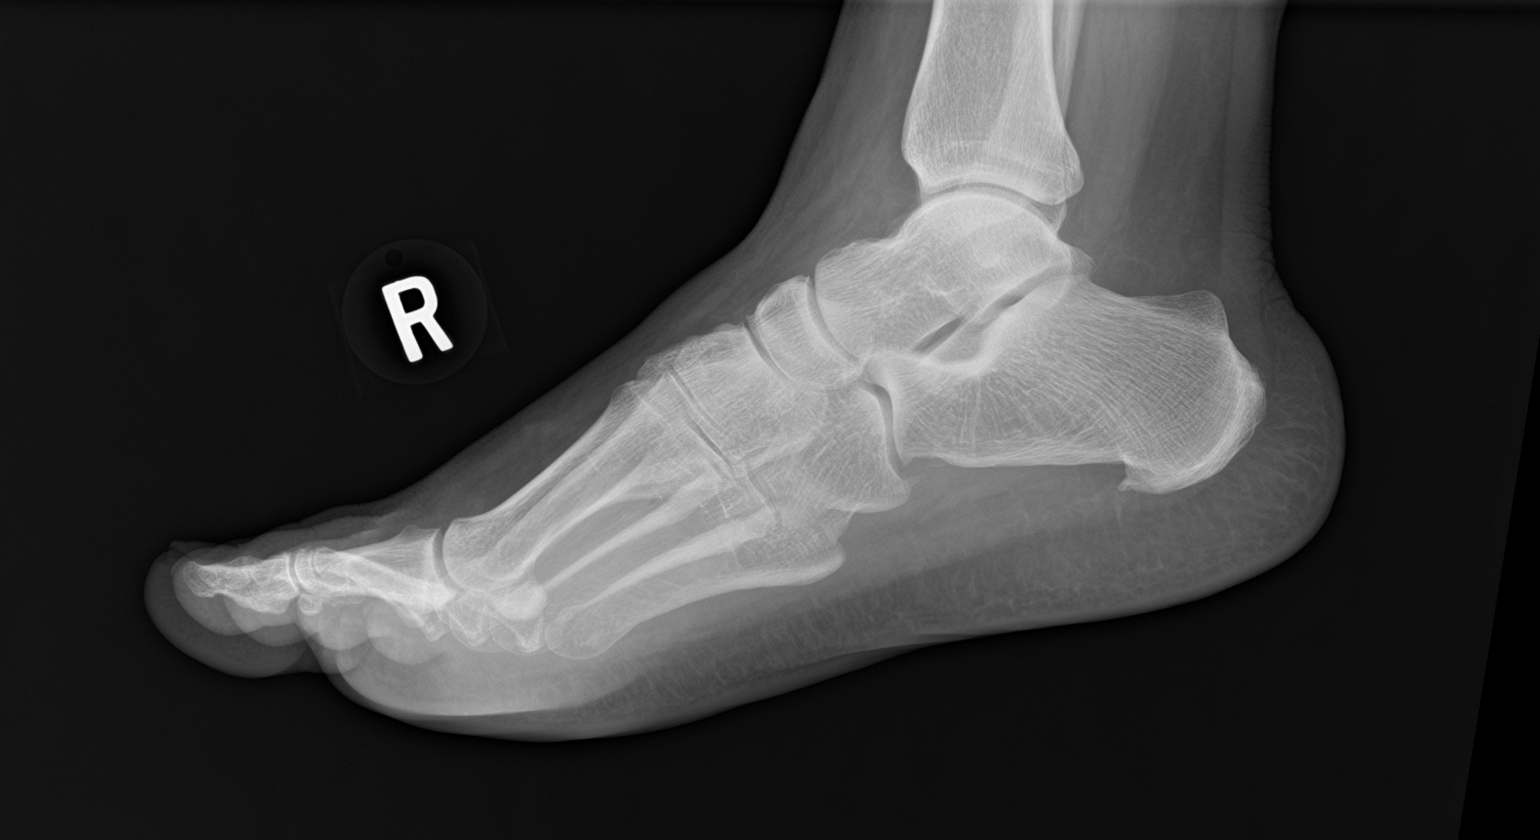

[3 of 3 positions shown; findings below may reference images not displayed]

FINDINGS: No fracture or malalignment.  Small plantar calcaneal spur.
IMPRESSION: No acute osseous abnormality.

## 2020-06-16 ENCOUNTER — Other Ambulatory Visit: Payer: Self-pay

## 2020-06-16 ENCOUNTER — Ambulatory Visit (HOSPITAL_COMMUNITY)
Admission: EM | Admit: 2020-06-16 | Discharge: 2020-06-16 | Disposition: A | Discharge status: Home / Self Care | Payer: Self-pay | Attending: Family Medicine | Admitting: Family Medicine

## 2020-06-16 ENCOUNTER — Encounter (HOSPITAL_COMMUNITY): Payer: Self-pay

## 2020-06-16 ENCOUNTER — Ambulatory Visit (INDEPENDENT_AMBULATORY_CARE_PROVIDER_SITE_OTHER): Payer: Self-pay

## 2020-06-16 DIAGNOSIS — M25551 Pain in right hip: Secondary | ICD-10-CM

## 2020-06-16 DIAGNOSIS — S79911A Unspecified injury of right hip, initial encounter: Secondary | ICD-10-CM

## 2020-06-16 MED ORDER — HYDROCODONE-ACETAMINOPHEN 5-325 MG PO TABS: 1.0000 | ORAL_TABLET | Freq: Four times a day (QID) | ORAL | 0 refills | Status: DC | PRN

## 2020-06-16 MED ORDER — IBUPROFEN 800 MG PO TABS: 800.0000 mg | ORAL_TABLET | Freq: Three times a day (TID) | ORAL | 0 refills | Status: DC

## 2020-06-16 MED ORDER — KETOROLAC TROMETHAMINE 60 MG/2ML IM SOLN
INTRAMUSCULAR | Status: AC
  Filled 2020-06-16: qty 2

## 2020-06-16 MED ORDER — KETOROLAC TROMETHAMINE 60 MG/2ML IM SOLN
60.0000 mg | Freq: Once | INTRAMUSCULAR | Status: AC
  Administered 2020-06-16: 60 mg via INTRAMUSCULAR

## 2020-06-16 NOTE — ED Triage Notes (Addendum)
Pt present right side hip pain from a fall at [employer]. Pt states it was raining and she fall on the concrete and injured her right hip. Pt states the pain comes and goes, her hip is also giving out as she is trying to walk.

## 2020-06-16 NOTE — Discharge Instructions (Addendum)

## 2020-06-17 NOTE — ED Provider Notes (Signed)
[place]   [id number] 06/16/20 Arrival Time: 4034  ASSESSMENT & PLAN:  1. Right hip pain     I have personally viewed the imaging studies ordered this visit. No fractures appreciated.   Meds ordered this encounter  Medications  . ketorolac (TORADOL) injection 60 mg  . ibuprofen (ADVIL) 800 MG tablet    Sig: Take 1 tablet (800 mg total) by mouth 3 (three) times daily with meals.    Dispense:  21 tablet    Refill:  0  . HYDROcodone-acetaminophen (NORCO/VICODIN) 5-325 MG tablet    Sig: Take 1 tablet by mouth every 6 (six) hours as needed for moderate pain or severe pain.    Dispense:  8 tablet    Refill:  0    Orders Placed This Encounter  Procedures  . DG Hip Unilat W or Wo Pelvis 1 View Right   WBAT.  Recommend:  Follow-up Information    [place].   Why: If worsening or failing to improve as anticipated. Contact information: [place] [phone]              [place] Controlled Substances Registry consulted for this patient. I feel the risk/benefit ratio today is favorable for proceeding with this prescription for a controlled substance. Medication sedation precautions given.  Reviewed expectations re: course of current medical issues. Questions answered. Outlined signs and symptoms indicating need for more acute intervention. Patient verbalized understanding. After Visit Summary given.  SUBJECTIVE: History from: patient. Tamara Cline is a 44 y.o. female who reports fairly persistent R hip pain s/p fall several days ago. Poorly localized discomfort, esp with weight bearing; stiff in morning. No extremity sensation changes or weakness. Normal bowel/bladder habits. OTC analgesics without much relief. No abd or back pain. History of similar: no.  Past Surgical History:  Procedure Laterality Date  . TONSILLECTOMY    . TUBAL LIGATION        OBJECTIVE:  Vitals:    06/16/20 1728  BP: (!) 144/87  Pulse: 85  Resp: 16  Temp: 98.2 F (36.8 C)  TempSrc: Oral  SpO2: 97%    General appearance: alert; no distress HEENT: [place]; AT Neck: supple with FROM Resp: unlabored respirations Extremities: moves bilateral LE normally; reports generalized TTP over L hip; body habitus makes exam difficulty Skin: warm and dry; no visible rashes Neurologic: gait normal; normal but slow; sensation and strength of bilateral LE Psychological: alert and cooperative; normal mood and affect  Imaging: DG Hip Unilat W or Wo Pelvis 1 View Right  Result Date: 06/16/2020 CLINICAL DATA:  Fall EXAM: DG HIP (WITH OR WITHOUT PELVIS) 1V RIGHT COMPARISON:  None. FINDINGS: There is no evidence of hip fracture or dislocation. There is no evidence of arthropathy or other focal bone abnormality. IMPRESSION: Negative. Electronically Signed   By: Ulyses Jarred M.D.   On: 06/16/2020 19:45      No Known Allergies  Past Medical History:  Diagnosis Date  . Asthma    Social History   Socioeconomic History  . Marital status: Divorced    Spouse name: Not on file  . Number of children: Not on file  . Years of education: Not on file  . Highest education level: Not on file  Occupational History  . Not on file  Tobacco Use  . Smoking status: Current Some Day Smoker    Types: Cigarettes  . Smokeless tobacco: Never Used  Substance and Sexual  Activity  . Alcohol use: Yes    Comment: Occasionally   . Drug use: Yes    Types: Cocaine    Comment: Reports using Cocaine 3 days ago   . Sexual activity: Yes    Birth control/protection: None  Other Topics Concern  . Not on file  Social History Narrative  . Not on file   Social Determinants of Health   Financial Resource Strain:   . Difficulty of Paying Living Expenses: Not on file  Food Insecurity:   . Worried About Charity fundraiser in the Last Year: Not on file  . Ran Out of Food in the Last Year: Not on file  Transportation  Needs:   . Lack of Transportation (Medical): Not on file  . Lack of Transportation (Non-Medical): Not on file  Physical Activity:   . Days of Exercise per Week: Not on file  . Minutes of Exercise per Session: Not on file  Stress:   . Feeling of Stress : Not on file  Social Connections:   . Frequency of Communication with Friends and Family: Not on file  . Frequency of Social Gatherings with Friends and Family: Not on file  . Attends Religious Services: Not on file  . Active Member of Clubs or Organizations: Not on file  . Attends Archivist Meetings: Not on file  . Marital Status: Not on file   Family History  Problem Relation Age of Onset  . Diabetes Mother   . Hypertension Mother   . Diabetes Father   . Hypertension Father    Past Surgical History:  Procedure Laterality Date  . TONSILLECTOMY    . TUBAL LIGATION        Vanessa Kick, MD 06/17/20 [phone]

## 2020-12-31 ENCOUNTER — Emergency Department (HOSPITAL_COMMUNITY)
Admission: EM | Admit: 2020-12-31 | Discharge: 2021-01-01 | Disposition: A | Discharge status: Home / Self Care | Payer: Self-pay | Attending: Physician Assistant | Admitting: Physician Assistant

## 2020-12-31 ENCOUNTER — Encounter (HOSPITAL_COMMUNITY): Payer: Self-pay

## 2020-12-31 ENCOUNTER — Other Ambulatory Visit: Payer: Self-pay

## 2020-12-31 DIAGNOSIS — R5383 Other fatigue: Secondary | ICD-10-CM | POA: Insufficient documentation

## 2020-12-31 DIAGNOSIS — R42 Dizziness and giddiness: Secondary | ICD-10-CM | POA: Insufficient documentation

## 2020-12-31 DIAGNOSIS — Z5321 Procedure and treatment not carried out due to patient leaving prior to being seen by health care provider: Secondary | ICD-10-CM | POA: Insufficient documentation

## 2020-12-31 LAB — CBC WITH DIFFERENTIAL/PLATELET
Abs Immature Granulocytes: 0.02 10*3/uL (ref 0.00–0.07)
Basophils Absolute: 0.1 10*3/uL (ref 0.0–0.1)
Basophils Relative: 1 %
Eosinophils Absolute: 0.1 10*3/uL (ref 0.0–0.5)
Eosinophils Relative: 1 %
HCT: 46.1 % — ABNORMAL HIGH (ref 36.0–46.0)
Hemoglobin: 15 g/dL (ref 12.0–15.0)
Immature Granulocytes: 0 %
Lymphocytes Relative: 29 %
Lymphs Abs: 2 10*3/uL (ref 0.7–4.0)
MCH: 31.1 pg (ref 26.0–34.0)
MCHC: 32.5 g/dL (ref 30.0–36.0)
MCV: 95.6 fL (ref 80.0–100.0)
Monocytes Absolute: 0.6 10*3/uL (ref 0.1–1.0)
Monocytes Relative: 10 %
Neutro Abs: 3.9 10*3/uL (ref 1.7–7.7)
Neutrophils Relative %: 59 %
Platelets: 308 10*3/uL (ref 150–400)
RBC: 4.82 MIL/uL (ref 3.87–5.11)
RDW: 14.2 % (ref 11.5–15.5)
WBC: 6.6 10*3/uL (ref 4.0–10.5)
nRBC: 0 % (ref 0.0–0.2)

## 2020-12-31 LAB — RAPID URINE DRUG SCREEN, HOSP PERFORMED
Amphetamines: NOT DETECTED
Barbiturates: NOT DETECTED
Benzodiazepines: NOT DETECTED
Cocaine: POSITIVE — AB
Opiates: NOT DETECTED
Tetrahydrocannabinol: NOT DETECTED

## 2020-12-31 LAB — COMPREHENSIVE METABOLIC PANEL
ALT: 20 U/L (ref 0–44)
AST: 19 U/L (ref 15–41)
Albumin: 3.5 g/dL (ref 3.5–5.0)
Alkaline Phosphatase: 71 U/L (ref 38–126)
Anion gap: 10 (ref 5–15)
BUN: 9 mg/dL (ref 6–20)
CO2: 23 mmol/L (ref 22–32)
Calcium: 9.1 mg/dL (ref 8.9–10.3)
Chloride: 106 mmol/L (ref 98–111)
Creatinine, Ser: 0.76 mg/dL (ref 0.44–1.00)
GFR, Estimated: >60 mL/min (ref >60)
Glucose, Bld: 131 mg/dL — ABNORMAL HIGH (ref 70–99)
Potassium: 4 mmol/L (ref 3.5–5.1)
Sodium: 139 mmol/L (ref 135–145)
Total Bilirubin: 0.3 mg/dL (ref 0.3–1.2)
Total Protein: 7.7 g/dL (ref 6.5–8.1)

## 2020-12-31 LAB — I-STAT BETA HCG BLOOD, ED (MC, WL, AP ONLY): I-stat hCG, quantitative: <5 m[IU]/mL (ref <5)

## 2020-12-31 NOTE — ED Triage Notes (Addendum)
Per EMS- Patient from home. Patient states she has been eating a lot of candy and soda for the past 1 1/2 hours. Patient states she has been under a lot of stress. [id number] with EMS.  Patient added that she is dizzy. Patient denies any weakness or numbness.  Patient added that she last used cocaine 2 days ago.

## 2020-12-31 NOTE — ED Notes (Signed)
Pt stated she didn't need a EKG

## 2020-12-31 NOTE — ED Notes (Signed)
Pt's ride was leaving and had to leave without being seen after triage because she was not going to have a way home after being seen.

## 2020-12-31 NOTE — ED Provider Notes (Signed)
Emergency Medicine Provider Triage Evaluation Note  Tamara Cline , a 45 y.o. female  was evaluated in triage.  Pt complains of dizziness that started pta. States sxs improve when she sits still but worsen when she moves her head. Denies vision changes, unilateral numbness/weakness. There are no other reported neurologic complaints. She has had similar sxs in the past  Review of Systems  Positive: dizziness Negative:  vision changes, unilateral numbness/weakness  Physical Exam  BP (!) 146/97   Pulse 70   Temp 98.1 F (36.7 C) (Oral)   Resp 20   SpO2 95%  Gen:   Awake, no distress   Resp:  Normal effort  MSK:   Moves extremities without difficulty  Other:  Mental Status:  Alert, thought content appropriate, able to give a coherent history. Speech fluent without evidence of aphasia. Able to follow 2 step commands without difficulty.  Cranial Nerves:  II:  Peripheral visual fields grossly normal, pupils equal, round, reactive to light III,IV, VI: ptosis not present, extra-ocular motions intact bilaterally  V,VII: smile symmetric, facial light touch sensation equal VIII: hearing grossly normal to voice  X: uvula elevates symmetrically  XI: bilateral shoulder shrug symmetric and strong XII: midline tongue extension without fassiculations Motor:  Normal tone. 5/5 strength of BUE and BLE major muscle groups including strong and equal grip strength and dorsiflexion/plantar flexion Sensory: light touch normal in all extremities.    Medical Decision Making  Medically screening exam initiated at 5:57 PM.  Appropriate orders placed.  Tamara Cline was informed that the remainder of the evaluation will be completed by another provider, this initial triage assessment does not replace that evaluation, and the importance of remaining in the ED until their evaluation is complete.     Tamara Cline 12/31/20 1759    Tamara Natal, MD 12/31/20 2114

## 2021-02-01 ENCOUNTER — Encounter (HOSPITAL_COMMUNITY): Payer: Self-pay | Admitting: Emergency Medicine

## 2021-02-01 ENCOUNTER — Other Ambulatory Visit: Payer: Self-pay

## 2021-02-01 ENCOUNTER — Ambulatory Visit (HOSPITAL_COMMUNITY)
Admission: EM | Admit: 2021-02-01 | Discharge: 2021-02-01 | Disposition: A | Discharge status: Home / Self Care | Payer: Self-pay | Attending: Physician Assistant | Admitting: Physician Assistant

## 2021-02-01 DIAGNOSIS — M25551 Pain in right hip: Secondary | ICD-10-CM

## 2021-02-01 DIAGNOSIS — I10 Essential (primary) hypertension: Secondary | ICD-10-CM

## 2021-02-01 DIAGNOSIS — R030 Elevated blood-pressure reading, without diagnosis of hypertension: Secondary | ICD-10-CM

## 2021-02-01 MED ORDER — GABAPENTIN 300 MG PO CAPS: 300.0000 mg | ORAL_CAPSULE | Freq: Every day | ORAL | 0 refills | Status: DC

## 2021-02-01 MED ORDER — KETOROLAC TROMETHAMINE 60 MG/2ML IM SOLN
60.0000 mg | Freq: Once | INTRAMUSCULAR | Status: AC
  Administered 2021-02-01: 60 mg via INTRAMUSCULAR

## 2021-02-01 MED ORDER — AMLODIPINE BESYLATE 5 MG PO TABS: 5.0000 mg | ORAL_TABLET | Freq: Every day | ORAL | 0 refills | Status: DC

## 2021-02-01 MED ORDER — KETOROLAC TROMETHAMINE 60 MG/2ML IM SOLN
INTRAMUSCULAR | Status: AC
  Filled 2021-02-01: qty 2

## 2021-02-01 NOTE — ED Triage Notes (Signed)
PT has had hip pain since December. She came here and received a "shot". It helped. She had a CT scan in ER recently.

## 2021-02-01 NOTE — Discharge Instructions (Signed)
Continue previously prescribed medication to help manage hip pain.  We are going to start gabapentin to see if this provides some relief.  This can make you sleepy do not drive or drink alcohol with it.  It is very importantly follow-up with specialist as we discussed for ongoing management.  If you have any worsening symptoms including inability to move your leg, weakness, increased pain you need to go to the emergency room.  Your blood pressure is elevated today.  It has been elevated at several recent visits so we are going to start a low-dose blood pressure medication and his amlodipine.  Please monitor your diet for salt and avoid caffeine and decongestants.  We will try to find your primary care provider but if you are unable to see 1 within a few weeks it is important that you return to our clinic for blood pressure recheck.  If you develop any chest pain, shortness of breath, leg swelling dizziness, feeling like you are to pass out with elevated blood pressure you need to go to the emergency room.

## 2021-02-01 NOTE — ED Provider Notes (Signed)
[place]    CSN: [id number] Arrival date & time: 02/01/21  1518      History   Chief Complaint Chief Complaint  Patient presents with   Hip Pain    From fall in december    HPI Tamara Cline is a 45 y.o. female.   Patient presents today with a several month history of right hip pain that is worsened recently.  Patient was evaluated by our clinic in November 2021 following a fall at which point x-ray was normal.  She reports symptoms never completely resolved and have gradually been worsening.  Approximately 1 to 2 weeks ago she was visiting grandkids out of town and had worsening of symptoms prompting her to go to emergency room.  She had CT scan that showed "bone-on-bone" and was prescribed prednisone, ibuprofen 800 mg, methocarbamol, oxycodone.  She reports taking medication as prescribed without any improvement of symptoms.  Unfortunately, records not available in care everywhere.  She has not seen orthopedic provider in the past.  Denies previous hip surgery.  She does report shooting pain down her right leg intermittently.  She denies any weakness but is having difficulty ambulating as result of the pain.  She is using crutches to help get around.  She denies any numbness or paresthesias.  Denies associated fever, nausea, vomiting.  In addition, patient does not have a primary care provider and is interested in establishing with 1.  She has been noted to have elevated blood pressure reading at several visits to urgent care and had an episode of dizziness a few months ago that required her to call 911 at which point her blood pressure was noted to be elevated.  She has not taken antihypertensive medication in the past.  She denies any current symptoms including chest pain, shortness of breath, peripheral edema, headache, visual disturbance.  She is not physically active due to right hip pain.  She does try to limit salt in her diet.   Past Medical History:  Diagnosis  Date   Asthma     There are no problems to display for this patient.   Past Surgical History:  Procedure Laterality Date   TONSILLECTOMY     TUBAL LIGATION      OB History   No obstetric history on file.      Home Medications    Prior to Admission medications   Medication Sig Start Date End Date Taking? Authorizing Provider  amLODipine (NORVASC) 5 MG tablet Take 1 tablet (5 mg total) by mouth daily. 02/01/21  Yes Margarie Mcguirt K, PA-C  gabapentin (NEURONTIN) 300 MG capsule Take 1 capsule (300 mg total) by mouth at bedtime. 02/01/21  Yes Haivyn Oravec, Derry Skill, PA-C  HYDROcodone-acetaminophen (NORCO/VICODIN) 5-325 MG tablet Take 1 tablet by mouth every 6 (six) hours as needed for moderate pain or severe pain. 06/16/20  Yes Vanessa Kick, MD  ibuprofen (ADVIL) 800 MG tablet Take 1 tablet (800 mg total) by mouth 3 (three) times daily with meals. 06/16/20  Yes Hagler, Aaron Edelman, MD  benzonatate (TESSALON) 100 MG capsule Take 1 capsule (100 mg total) by mouth every 8 (eight) hours. 07/17/19   Vanessa Kick, MD  esomeprazole (NEXIUM) 40 MG capsule Take 40 mg by mouth daily at 12 noon.    [provider]    Family History Family History  Problem Relation Age of Onset   Diabetes Mother    Hypertension Mother    Diabetes Father    Hypertension Father  Social History Social History   Tobacco Use   Smoking status: Some Days    Pack years: 0.00    Types: Cigarettes   Smokeless tobacco: Never  Vaping Use   Vaping Use: Never used  Substance Use Topics   Alcohol use: Yes    Comment: Occasionally    Drug use: Yes    Types: Cocaine     Allergies   Patient has no known allergies.   Review of Systems Review of Systems  Constitutional:  Positive for activity change. Negative for appetite change, fatigue and fever.  Respiratory:  Negative for cough and shortness of breath.   Cardiovascular:  Negative for chest pain, palpitations and leg swelling.  Gastrointestinal:   Negative for abdominal pain, diarrhea, nausea and vomiting.  Musculoskeletal:  Positive for arthralgias, gait problem and joint swelling. Negative for myalgias.  Neurological:  Negative for dizziness, weakness, light-headedness, numbness and headaches.    Physical Exam Triage Vital Signs ED Triage Vitals  Enc Vitals Group     BP 02/01/21 1628 (!) 149/103     Pulse Rate 02/01/21 1626 83     Resp 02/01/21 1626 16     Temp 02/01/21 1626 98.2 F (36.8 C)     Temp Source 02/01/21 1626 Oral     SpO2 02/01/21 1626 100 %     Weight --      Height --      Head Circumference --      Peak Flow --      Pain Score 02/01/21 1624 10     Pain Loc --      Pain Edu? --      Excl. in [place]? --    No data found.  Updated Vital Signs BP (!) 149/103   Pulse 83   Temp 98.2 F (36.8 C) (Oral)   Resp 16   LMP 01/24/2021   SpO2 100%   Visual Acuity Right Eye Distance:   Left Eye Distance:   Bilateral Distance:    Right Eye Near:   Left Eye Near:    Bilateral Near:     Physical Exam Vitals reviewed.  Constitutional:      General: She is awake. She is not in acute distress.    Appearance: Normal appearance. She is not ill-appearing.     Comments: Very pleasant female appears in age in no acute distress obviously uncomfortable   HENT:     Head: Normocephalic and atraumatic.  Cardiovascular:     Rate and Rhythm: Normal rate and regular rhythm.     Heart sounds: Normal heart sounds, S1 normal and S2 normal. No murmur heard. Pulmonary:     Effort: Pulmonary effort is normal.     Breath sounds: Normal breath sounds. No wheezing, rhonchi or rales.     Comments: Clear to auscultation bilaterally Abdominal:     General: Bowel sounds are normal.     Palpations: Abdomen is soft.     Tenderness: There is no abdominal tenderness. There is no right CVA tenderness, left CVA tenderness, guarding or rebound.  Musculoskeletal:     Right hip: Tenderness and bony tenderness present. Decreased range of  motion. Decreased strength.     Comments: Right hip: Patient unable to get onto exam table for complete evaluation.  Decreased range of motion with flexion and extension.  Tenderness palpation over anterior right hip.  Strength 4/5 right hip compared to 5/5 left hip.  Psychiatric:        Behavior: Behavior  is cooperative.     UC Treatments / Results  Labs (all labs ordered are listed, but only abnormal results are displayed) Labs Reviewed - No data to display  EKG   Radiology No results found.  Procedures Procedures (including critical care time)  Medications Ordered in UC Medications  ketorolac (TORADOL) injection 60 mg (60 mg Intramuscular Given 02/01/21 1658)    Initial Impression / Assessment and Plan / UC Course  I have reviewed the triage vital signs and the nursing notes.  Pertinent labs & imaging results that were available during my care of the patient were reviewed by me and considered in my medical decision making (see chart for details).     Unfortunately, we were unable to find records from recent ER visit in care everywhere.  Given patient had a normal x-ray in November and had a CT scan that showed no acute abnormalities more recently imaging was not performed today.  Discussed that we are unable to provide stronger pain medication than the oxycodone she has already been prescribed.  Recommend that she complete steroid taper as previously prescribed.  She was given Toradol injection in clinic today with the hopes provide some relief of symptoms.  She was given gabapentin 300 mg to take at night.  We discussed that this can make her drowsy and dizzy she should not drive or drink alcohol while taking this medication.  Discussed the importance of following up with orthopedic provider given severity of symptoms.  Discussed alarm symptoms that warrant emergent evaluation.  Strict return precautions given to which patient expressed understanding.  Blood pressure has been  consistently elevated.  We will start amlodipine 5 mg daily.  We will try to establish patient with PCP via PCP assistance.  Discussed that she will need to have blood pressure rechecked in approximately 1 to 2 weeks and if she has not seen PCP in this timeframe she should return to clinic.  Encouraged her to limit caffeine, salt, decongestants.  Discussed alarm symptoms or warrant emergent evaluation.  Strict return precautions given to which patient expressed understanding.   Final Clinical Impressions(s) / UC Diagnoses   Final diagnoses:  Right hip pain  Essential hypertension  Elevated blood pressure reading     Discharge Instructions      Continue previously prescribed medication to help manage hip pain.  We are going to start gabapentin to see if this provides some relief.  This can make you sleepy do not drive or drink alcohol with it.  It is very importantly follow-up with specialist as we discussed for ongoing management.  If you have any worsening symptoms including inability to move your leg, weakness, increased pain you need to go to the emergency room.  Your blood pressure is elevated today.  It has been elevated at several recent visits so we are going to start a low-dose blood pressure medication and his amlodipine.  Please monitor your diet for salt and avoid caffeine and decongestants.  We will try to find your primary care provider but if you are unable to see 1 within a few weeks it is important that you return to our clinic for blood pressure recheck.  If you develop any chest pain, shortness of breath, leg swelling dizziness, feeling like you are to pass out with elevated blood pressure you need to go to the emergency room.     ED Prescriptions     Medication Sig Dispense Auth. Provider   amLODipine (NORVASC) 5 MG tablet Take  1 tablet (5 mg total) by mouth daily. 30 tablet Tobiah Celestine K, PA-C   gabapentin (NEURONTIN) 300 MG capsule Take 1 capsule (300 mg total) by  mouth at bedtime. 30 capsule Cathy Crounse K, PA-C      PDMP not reviewed this encounter.   Terrilee Croak, PA-C 02/01/21 1714

## 2021-02-07 ENCOUNTER — Encounter: Payer: Self-pay | Admitting: *Deleted

## 2021-03-11 ENCOUNTER — Encounter (INDEPENDENT_AMBULATORY_CARE_PROVIDER_SITE_OTHER): Payer: Self-pay | Admitting: Primary Care

## 2021-03-11 ENCOUNTER — Ambulatory Visit (INDEPENDENT_AMBULATORY_CARE_PROVIDER_SITE_OTHER): Payer: Self-pay | Admitting: Primary Care

## 2021-03-11 ENCOUNTER — Other Ambulatory Visit: Payer: Self-pay

## 2021-03-11 VITALS — BP 129/86 | HR 93 | Temp 97.3°F | Ht 65.0 in | Wt 348.0 lb

## 2021-03-11 DIAGNOSIS — M25551 Pain in right hip: Secondary | ICD-10-CM

## 2021-03-11 DIAGNOSIS — Z6841 Body Mass Index (BMI) 40.0 and over, adult: Secondary | ICD-10-CM

## 2021-03-11 DIAGNOSIS — Z7689 Persons encountering health services in other specified circumstances: Secondary | ICD-10-CM

## 2021-03-11 DIAGNOSIS — Z09 Encounter for follow-up examination after completed treatment for conditions other than malignant neoplasm: Secondary | ICD-10-CM

## 2021-03-11 DIAGNOSIS — F17210 Nicotine dependence, cigarettes, uncomplicated: Secondary | ICD-10-CM

## 2021-03-11 DIAGNOSIS — G8929 Other chronic pain: Secondary | ICD-10-CM

## 2021-03-11 MED ORDER — NAPROXEN 500 MG PO TABS: 500.0000 mg | ORAL_TABLET | Freq: Three times a day (TID) | ORAL | 1 refills | Status: DC

## 2021-03-11 NOTE — Progress Notes (Signed)
[facility]   Subjective:   Tamara Cline is a 45 y.o. female presents for hospital follow up and establish care.  02/01/21, patient presented to the ED for worsening hip pain causing her unstable gait and increase of pain. Discuss her ED visit and blood work, weight and increase risk for surgery-  Past Medical History:  Diagnosis Date   Asthma      No Known Allergies    Current Outpatient Medications on File Prior to Visit  Medication Sig Dispense Refill   amLODipine (NORVASC) 5 MG tablet Take 1 tablet (5 mg total) by mouth daily. (Patient not taking: Reported on 03/11/2021) 30 tablet 0   esomeprazole (NEXIUM) 40 MG capsule Take 40 mg by mouth daily at 12 noon.     No current facility-administered medications on file prior to visit.     Review of System: Review of Systems  Musculoskeletal:  Positive for back pain, falls and neck pain.       Falls due to Hip pain causing unsteady gait.  Neurological:  Positive for weakness.  Psychiatric/Behavioral:  Positive for depression. The patient is nervous/anxious and has insomnia.   All other systems reviewed and are negative.  Objective:  BP 129/86 (BP Location: Left Arm, Patient Position: Sitting, Cuff Size: Large)   Pulse 93   Temp (!) 97.3 F (36.3 C) (Temporal)   Ht '5\' 5"'$  (1.651 m)   Wt (!) 348 lb (157.9 kg)   LMP 02/24/2021 (Exact Date)   SpO2 93%   BMI 57.91 kg/m   Filed Weights   03/11/21 0836  Weight: (!) 348 lb (157.9 kg)    Physical Exam: General Appearance: Well nourished, in no apparent distress. Severe morbid obesity  Eyes: PERRLA, EOMs, conjunctiva no swelling or erythema Sinuses: No Frontal/maxillary tenderness ENT/Mouth: Ext aud canals clear, TMs without erythema, bulging.   Hearing normal.  Neck: Supple, thyroid normal. (Thick) Respiratory: Respiratory effort normal, BS equal bilaterally without rales, rhonchi, wheezing or stridor.  Cardio: RRR with no MRGs. Brisk peripheral pulses  without edema.  Abdomen: Soft, + BS.  Non tender, no guarding, rebound, hernias, masses. Lymphatics: Non tender without lymphadenopathy.  Musculoskeletal: Full ROM, 5/5 strength, unsteady gait. Milana Na) Skin: Warm, dry without rashes, lesions, ecchymosis.  Neuro: Cranial nerves intact. Normal muscle tone, no cerebellar symptoms. Sensation intact.  Psych: Awake and oriented X 3, normal affect, Insight and Judgment appropriate.    Assessment:  Bertina was seen today for hospitalization follow-up.  Diagnoses and all orders for this visit:  Encounter to establish care Establish care with new PCP  Hospital discharge follow-up Recommended f/u with Ortho, [facility] (Specialist)- uninsured advise to apply for medicaid she has and has been denied - ask front desk for financial aid packet  Morbid obesity ([place]) Morbid Obesity is > 40 indicating an excess in caloric intake or underlining conditions. This may lead to other co-morbidities. Lifestyle modifications of diet and exercise may reduce obesity.    Chronic pain of right hip Work on losing weight to help reduce hip pain. May alternate with heat and ice application for pain relief. May also alternate with acetaminophen and naproxen  for relief. Other alternatives include massage, acupuncture and water aerobics.  You must stay active and avoid a sedentary lifestyle.   Other orders -     naproxen (NAPROSYN) 500 MG tablet; Take 1 tablet (500 mg total) by mouth 3 (three) times daily with meals.    This note has been created with Dragon speech recognition  Engineer, drilling. Any transcriptional errors are unintentional.   Kerin Perna, NP 03/11/2021, 8:55 AM

## 2021-03-11 NOTE — Patient Instructions (Signed)
8 Easy Exercises You Can Do Sitting Down  Got a chair? Then you're ready for this sit-down, total-body workout!.  Safety precaution: Pay attention to your body during the movements - if anything hurts or causes pain, stop immediately. And check with your doctor first before beginning this, or any, exercise program.   Sunshine Arm Circles Seated in a chair with good posture, hold a ball in both hands with arms extended above your head and/or in front of you, keeping elbows slightly bent. Visualizing the face of a clock out in front of you, begin by holding arms up overhead at 12 o'clock. Circle the ball around to go all the way around the clock in a controlled, fluid motion. When you've reached 12 o'clock again, reverse directions and circle the opposite way. Keep alternating circle directions for 8 repetitions. Rest. Do another set of 8 repetitions.  Modification: A ball is not required for this exercise. Imagine that you are holding a ball while performing the motion. If it is difficult to bring your arms overhead, extend them out in front of you and move arms as if drawing a circle on the wall with or without the ball.   Tummy Twists Seated in a chair with good posture, hold a ball with both hands close to the body, with elbows bent and pulled in close to the ribcage. Slowly rotate your torso to the right as far as you comfortably can, being sure to keep the rest of your body still and stable. Rotate back to the center and repeat in the opposite direction. Do this 8 times, with two twists counting as a full set. Rest. Do another 8 sets (two twists each). Modification: A ball is not required for this exercise. Imagine you are holding a ball while performing the motion, or hold a small object such as a can of soup or water bottle to add resistance   Ball Chest Press Seated in a chair with good posture, hold a ball with both hands at chest level, palms facing toward each other and  elbows bent. Avoid bending forward by keeping your shoulders back at all times. Squeeze the ball slightly as you push the ball away from you in a fluid motion, taking about 2 seconds to extend the arms. Squeeze your shoulder blades together as you pull the ball back toward your chest. Repeat the push and pull motion 10 to 15 times. Rest. Do another set of 10 to 15 repetitions.  Modification: For a greater challenge, add a Tai Chi feel by standing with one leg slightly in front of the other (with a chair nearby if needed for extra balance) and slowly rocking the entire body forward and back as you push the ball away and pull back in.     Front Arm Raises In a seated position with good posture, hold a ball in both hands with palms facing each other. Extend the arms out in front of your body, keeping your elbows slightly bent. Starting with the ball lowered toward the knees, slowly raise your arms to lift the ball up to shoulder level (no higher), then lower the ball back to the starting position, taking about 2 to 3 seconds to lift and lower. Repeat 10 to 15 times. Rest. Do another set of 10 to 15 repetitions. Modification: A ball is not required for this exercise. Imagine you are holding a ball as you perform the motion, or hold a small object, such as a can of soup or   water bottle for added resistance.        Inner Thigh Squeezes Sitting toward the edge of a chair with good posture and knees bent, place a ball in between your knees; press the knees together to squeeze the ball, taking about 1 to 2 seconds to squeeze. You should feel the resistance in your inner thighs. Slowly release, keeping slight tension on the ball so that it does not fall. Repeat 8 to 10 times. Rest. Do another set of 8 to 10 repetitions. Modification: For a greater challenge, change the count of the squeezes by squeezing the ball and holding for 5 seconds, then releasing again. Or, do short, quick pulsing  squeezes.     Knee Extensions Sitting toward the edge of a chair with good posture and bent knees, hold on to the sides of the chair with your hands. Extend the right knee out so that the toes come up toward the ceiling, being sure to keep the knee slightly bent without locking it through the entire movement. Lower the leg back to a bent position and repeat this movement 8 to 10 times, using about 2 seconds each to lift and lower the leg. Switch to the opposite leg and perform 8 to 10 repetitions. Rest briefly. Do another set of 8 to 10 repetitions for each leg. Modification: If you are more advanced, sitting in the same position as above, extend one leg out in front of you with toes pointed to the ceiling. Lift and lower the entire leg only as high as you comfortably can, keeping the knee slightly bent. The longer lever adds difficulty to the exercise.   Elbow to Knee Seated toward the edge of a chair with good posture and knees bent, start with your right arm extended up overhead. Slowly lift the left knee up as you lower your right elbow down toward your left knee, taking about 2 seconds to lower down. Try not to bend over at the waist. Release and go back to the starting position. Repeat 8 to 10 times. Switch sides and do 8 to 10 repetitions, pulling one elbow to the opposite knee. Rest. Do another set of 8 to 10 repetitions on each side. Modification: Try this (with a chair nearby for balance) exercise in a standing position for an increased range of motion.     Overhead Arm Extensions Seated in a chair with good posture, hold a ball with both hands and raise it up over your head, with arms extended without locking the elbows. Keeping the elbows pulled in toward the head, slowly bend the elbows to lower the ball down along the back of the neck, using about 2 seconds to go down, then 2 seconds to push the ball back up over your head. Repeat 8 to 10 times. Rest. Do another  set of 8 to 10 repetitions. Modification: Try seated tricep extensions (ball not required for this modification). Bending slightly forward with elbows tucked into your sides, slowly extend the elbows so that your forearms go back behind you, keeping the elbows pulled up and in for the entire movement. Return to the starting position and repeat. Hold soup cans or small weights for added resistance.   Calorie Counting for Weight Loss Calories are units of energy. Your body needs a certain number of calories from food to keep going throughout the day. When you eat or drink more calories than your body needs, your body stores the extra calories mostly as fat. When you eat  or drink fewer calories than your body needs, your body burns fat to getthe energy it needs. Calorie counting means keeping track of how many calories you eat and drink each day. Calorie counting can be helpful if you need to lose weight. If you eat fewer calories than your body needs, you should lose weight. Ask yourhealth care provider what a healthy weight is for you. For calorie counting to work, you will need to eat the right number of calories each day to lose a healthy amount of weight per week. A dietitian can help you figure out how many calories you need in a day and will suggest ways to reach your calorie goal. A healthy amount of weight to lose each week is usually 1-2 lb (0.5-0.9 kg). This usually means that your daily calorie intake should be reduced by 500-750 calories. Eating 1,200-1,500 calories a day can help most women lose weight. Eating 1,500-1,800 calories a day can help most men lose weight. What do I need to know about calorie counting? Work with your health care provider or dietitian to determine how many calories you should get each day. To meet your daily calorie goal, you will need to: Find out how many calories are in each food that you would like to eat. Try to do this before you eat. Decide how much of the  food you plan to eat. Keep a food log. Do this by writing down what you ate and how many calories it had. To successfully lose weight, it is important to balance calorie counting with ahealthy lifestyle that includes regular activity. Where do I find calorie information?  The number of calories in a food can be found on a Nutrition Facts label. If a food does not have a Nutrition Facts label, try to look up the calories onlineor ask your dietitian for help. Remember that calories are listed per serving. If you choose to have more than one serving of a food, you will have to multiply the calories per serving by the number of servings you plan to eat. For example, the label on a package of bread might say that a serving size is 1 slice and that there are 90 calories in a serving. If you eat 1 slice, you will have eaten 90 calories. If you eat 2slices, you will have eaten 180 calories. How do I keep a food log? After each time that you eat, record the following in your food log as soon as possible: What you ate. Be sure to include toppings, sauces, and other extras on the food. How much you ate. This can be measured in cups, ounces, or number of items. How many calories were in each food and drink. The total number of calories in the food you ate. Keep your food log near you, such as in a pocket-sized notebook or on an app or website on your mobile phone. Some programs will calculate calories for you andshow you how many calories you have left to meet your daily goal. What are some portion-control tips? Know how many calories are in a serving. This will help you know how many servings you can have of a certain food. Use a measuring cup to measure serving sizes. You could also try weighing out portions on a kitchen scale. With time, you will be able to estimate serving sizes for some foods. Take time to put servings of different foods on your favorite plates or in your favorite bowls and cups so you  know what a serving looks like. Try not to eat straight from a food's packaging, such as from a bag or box. Eating straight from the package makes it hard to see how much you are eating and can lead to overeating. Put the amount you would like to eat in a cup or on a plate to make sure you are eating the right portion. Use smaller plates, glasses, and bowls for smaller portions and to prevent overeating. Try not to multitask. For example, avoid watching TV or using your computer while eating. If it is time to eat, sit down at a table and enjoy your food. This will help you recognize when you are full. It will also help you be more mindful of what and how much you are eating. What are tips for following this plan? Reading food labels Check the calorie count compared with the serving size. The serving size may be smaller than what you are used to eating. Check the source of the calories. Try to choose foods that are high in protein, fiber, and vitamins, and low in saturated fat, trans fat, and sodium. Shopping Read nutrition labels while you shop. This will help you make healthy decisions about which foods to buy. Pay attention to nutrition labels for low-fat or fat-free foods. These foods sometimes have the same number of calories or more calories than the full-fat versions. They also often have added sugar, starch, or salt to make up for flavor that was removed with the fat. Make a grocery list of lower-calorie foods and stick to it. Cooking Try to cook your favorite foods in a healthier way. For example, try baking instead of frying. Use low-fat dairy products. Meal planning Use more fruits and vegetables. One-half of your plate should be fruits and vegetables. Include lean proteins, such as chicken, [place], and fish. Lifestyle Each week, aim to do one of the following: 150 minutes of moderate exercise, such as walking. 75 minutes of vigorous exercise, such as running. General  information Know how many calories are in the foods you eat most often. This will help you calculate calorie counts faster. Find a way of tracking calories that works for you. Get creative. Try different apps or programs if writing down calories does not work for you. What foods should I eat?  Eat nutritious foods. It is better to have a nutritious, high-calorie food, such as an avocado, than a food with few nutrients, such as a bag of potato chips. Use your calories on foods and drinks that will fill you up and will not leave you hungry soon after eating. Examples of foods that fill you up are nuts and nut butters, vegetables, lean proteins, and high-fiber foods such as whole grains. High-fiber foods are foods with more than 5 g of fiber per serving. Pay attention to calories in drinks. Low-calorie drinks include water and unsweetened drinks. The items listed above may not be a complete list of foods and beverages you can eat. Contact a dietitian for more information. What foods should I limit? Limit foods or drinks that are not good sources of vitamins, minerals, or protein or that are high in unhealthy fats. These include: Candy. Other sweets. Sodas, specialty coffee drinks, alcohol, and juice. The items listed above may not be a complete list of foods and beverages you should avoid. Contact a dietitian for more information. How do I count calories when eating out? Pay attention to portions. Often, portions are much larger when eating out. Try  these tips to keep portions smaller: Consider sharing a meal instead of getting your own. If you get your own meal, eat only half of it. Before you start eating, ask for a container and put half of your meal into it. When available, consider ordering smaller portions from the menu instead of full portions. Pay attention to your food and drink choices. Knowing the way food is cooked and what is included with the meal can help you eat fewer calories. If  calories are listed on the menu, choose the lower-calorie options. Choose dishes that include vegetables, fruits, whole grains, low-fat dairy products, and lean proteins. Choose items that are boiled, broiled, grilled, or steamed. Avoid items that are buttered, battered, fried, or served with cream sauce. Items labeled as crispy are usually fried, unless stated otherwise. Choose water, low-fat milk, unsweetened iced tea, or other drinks without added sugar. If you want an alcoholic beverage, choose a lower-calorie option, such as a glass of wine or light beer. Ask for dressings, sauces, and syrups on the side. These are usually high in calories, so you should limit the amount you eat. If you want a salad, choose a garden salad and ask for grilled meats. Avoid extra toppings such as bacon, cheese, or fried items. Ask for the dressing on the side, or ask for olive oil and vinegar or lemon to use as dressing. Estimate how many servings of a food you are given. Knowing serving sizes will help you be aware of how much food you are eating at restaurants. Where to find more information Centers for Disease Control and Prevention: http://www.wolf.info/ [place] Department of Agriculture: http://www.wilson-mendoza.org/ Summary Calorie counting means keeping track of how many calories you eat and drink each day. If you eat fewer calories than your body needs, you should lose weight. A healthy amount of weight to lose per week is usually 1-2 lb (0.5-0.9 kg). This usually means reducing your daily calorie intake by 500-750 calories. The number of calories in a food can be found on a Nutrition Facts label. If a food does not have a Nutrition Facts label, try to look up the calories online or ask your dietitian for help. Use smaller plates, glasses, and bowls for smaller portions and to prevent overeating. Use your calories on foods and drinks that will fill you up and not leave you hungry shortly after a meal. This information is not intended to  replace advice given to you by your health care provider. Make sure you discuss any questions you have with your healthcare provider. Document Revised: 09/04/2019 Document Reviewed: 09/04/2019 Elsevier Patient Education  2022 [employer]. .mes

## 2021-06-13 ENCOUNTER — Ambulatory Visit (INDEPENDENT_AMBULATORY_CARE_PROVIDER_SITE_OTHER): Payer: Self-pay | Admitting: Primary Care

## 2021-06-23 ENCOUNTER — Ambulatory Visit (INDEPENDENT_AMBULATORY_CARE_PROVIDER_SITE_OTHER): Payer: Self-pay | Admitting: Primary Care

## 2022-03-23 IMAGING — DX DG HIP (WITH OR WITHOUT PELVIS) 1V*R*
3 series · 3 of 3 positions shown · non-contrast
Comparison: None.

CLINICAL DATA: Fall

EXAM:
DG HIP (WITH OR WITHOUT PELVIS) 1V RIGHT

[pelvis ap]
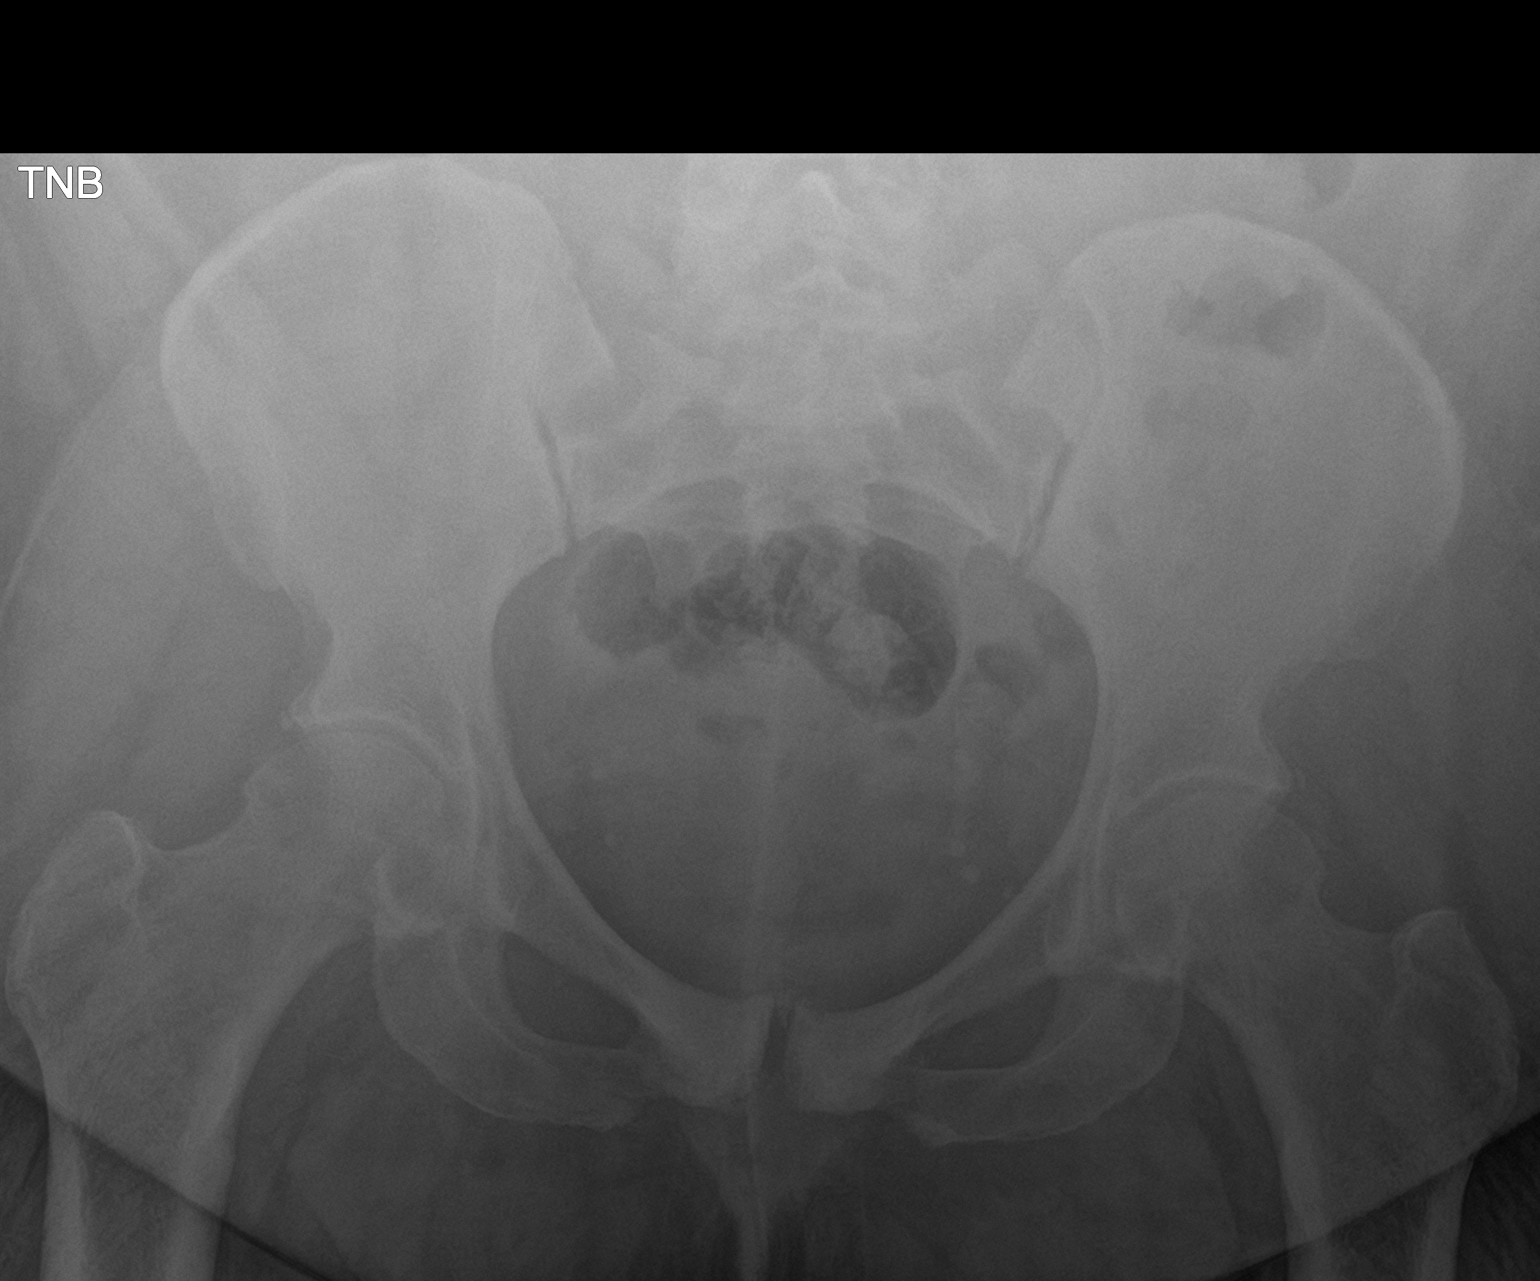

[hip ap]
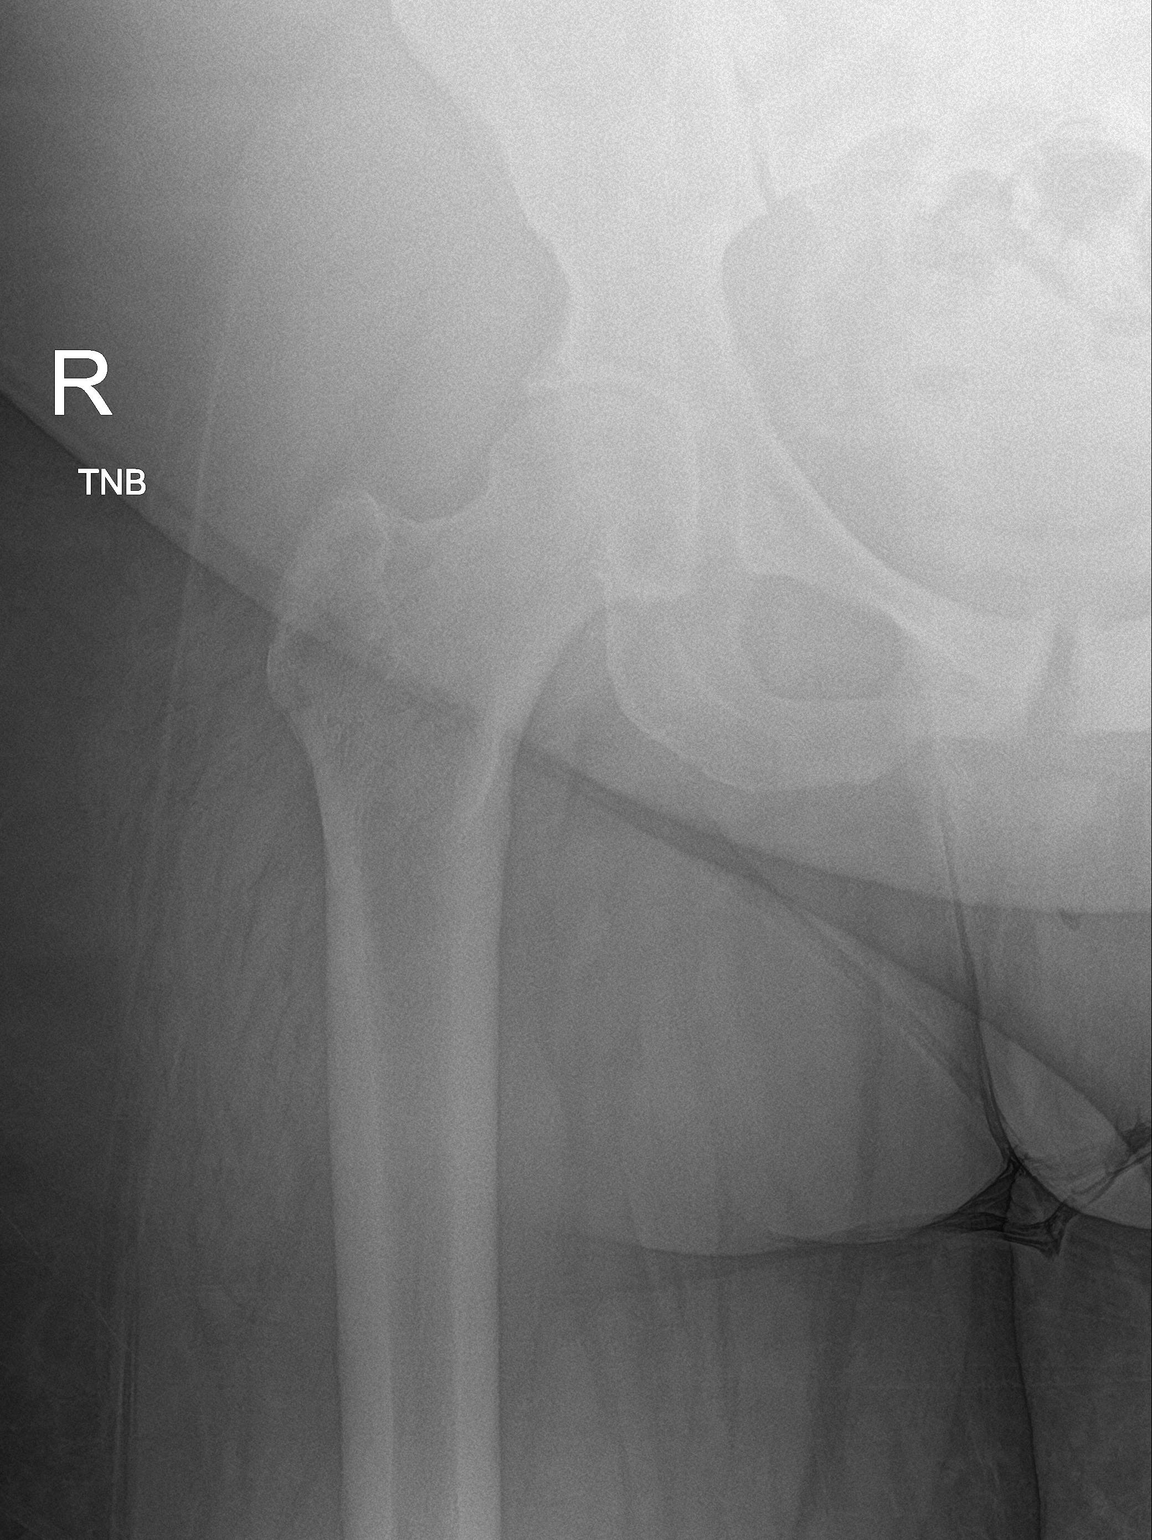

[hip lat]
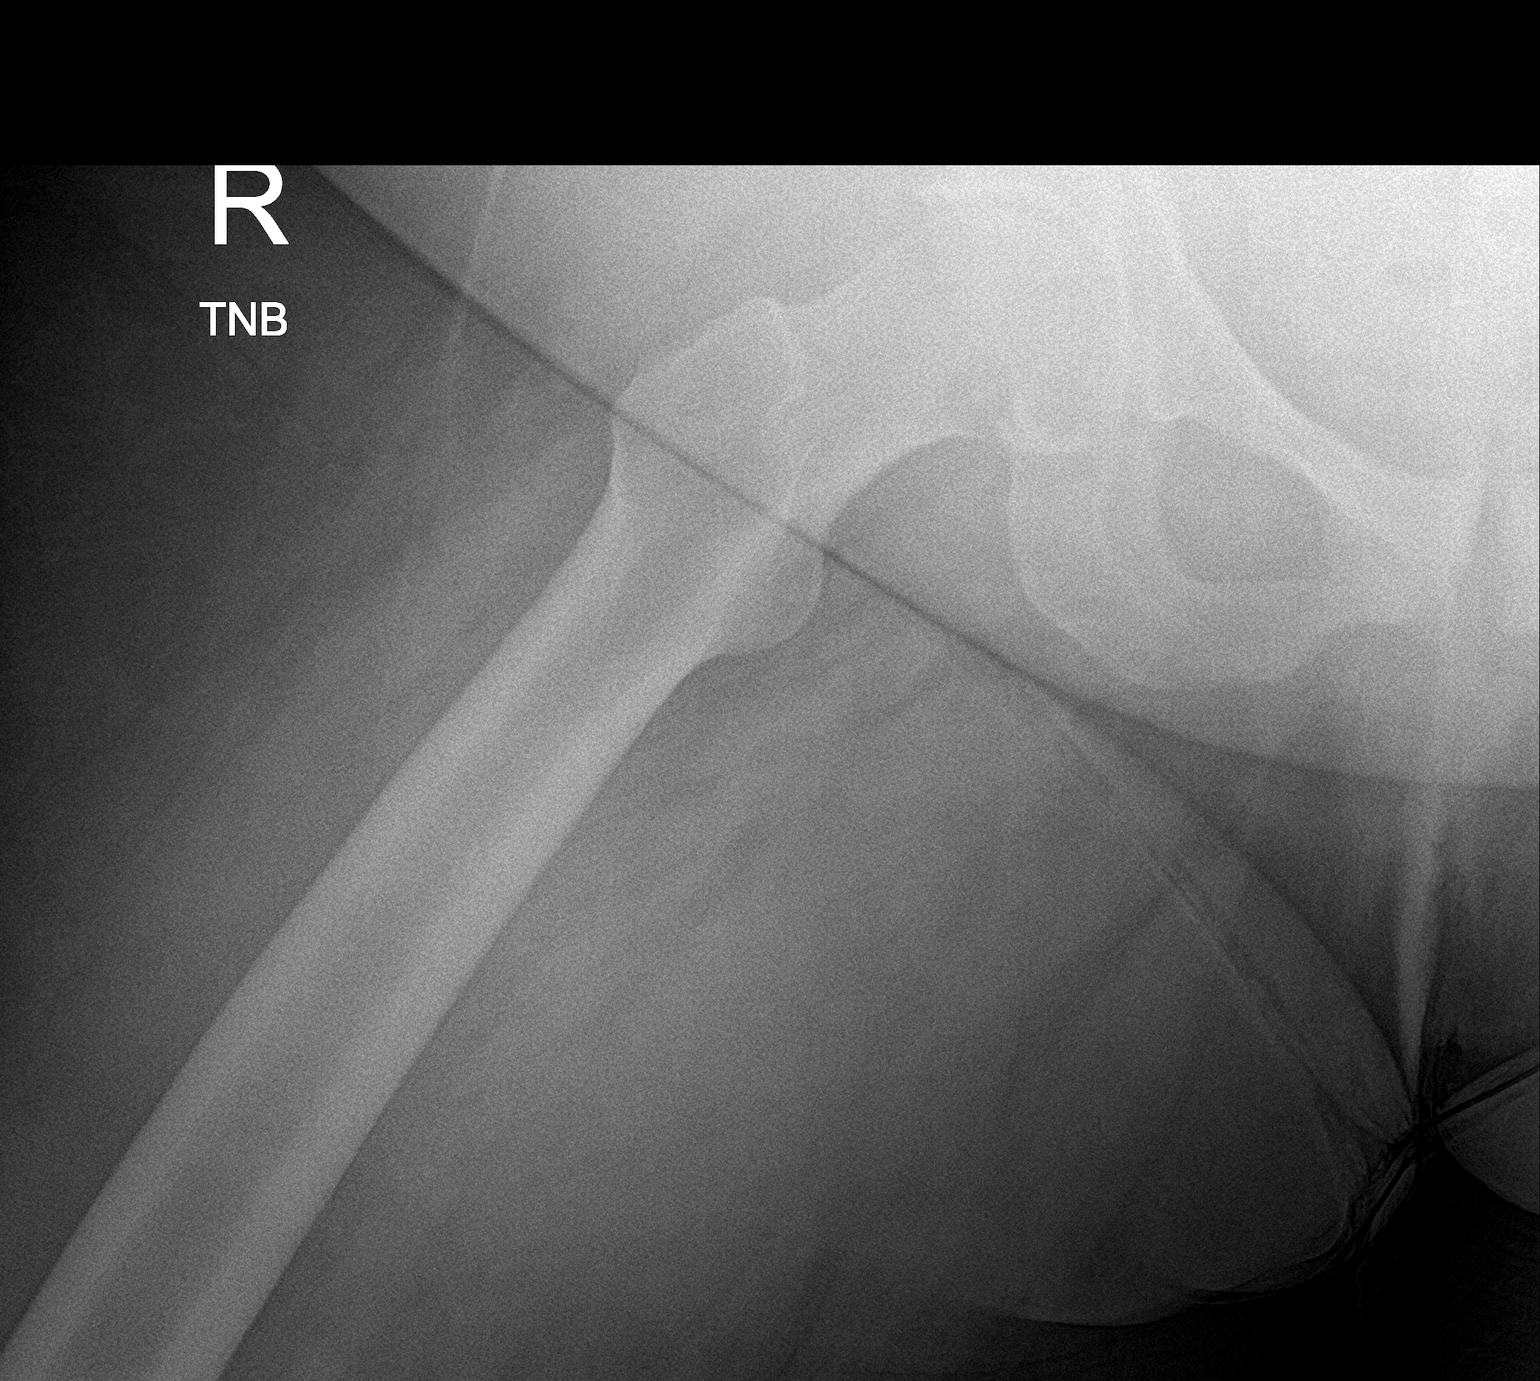

[3 of 3 positions shown; findings below may reference images not displayed]

FINDINGS: There is no evidence of hip fracture or dislocation. There is no
evidence of arthropathy or other focal bone abnormality.
IMPRESSION: Negative.

## 2022-03-30 ENCOUNTER — Ambulatory Visit (INDEPENDENT_AMBULATORY_CARE_PROVIDER_SITE_OTHER): Payer: Self-pay | Admitting: Primary Care

## 2022-04-04 ENCOUNTER — Encounter (INDEPENDENT_AMBULATORY_CARE_PROVIDER_SITE_OTHER): Payer: Self-pay | Admitting: Primary Care

## 2022-05-04 ENCOUNTER — Other Ambulatory Visit: Payer: Self-pay

## 2022-05-04 ENCOUNTER — Emergency Department (HOSPITAL_COMMUNITY)
Admission: EM | Admit: 2022-05-04 | Discharge: 2022-05-04 | Disposition: A | Discharge status: Home / Self Care | Payer: 59 | Attending: Emergency Medicine | Admitting: Emergency Medicine

## 2022-05-04 ENCOUNTER — Encounter (HOSPITAL_COMMUNITY): Payer: Self-pay | Admitting: Emergency Medicine

## 2022-05-04 ENCOUNTER — Emergency Department (HOSPITAL_COMMUNITY): Payer: 59

## 2022-05-04 DIAGNOSIS — M542 Cervicalgia: Secondary | ICD-10-CM | POA: Diagnosis not present

## 2022-05-04 DIAGNOSIS — M25511 Pain in right shoulder: Secondary | ICD-10-CM | POA: Diagnosis not present

## 2022-05-04 DIAGNOSIS — I700 Atherosclerosis of aorta: Secondary | ICD-10-CM | POA: Diagnosis not present

## 2022-05-04 DIAGNOSIS — I10 Essential (primary) hypertension: Secondary | ICD-10-CM | POA: Insufficient documentation

## 2022-05-04 DIAGNOSIS — F17200 Nicotine dependence, unspecified, uncomplicated: Secondary | ICD-10-CM | POA: Diagnosis not present

## 2022-05-04 DIAGNOSIS — J45909 Unspecified asthma, uncomplicated: Secondary | ICD-10-CM | POA: Diagnosis not present

## 2022-05-04 DIAGNOSIS — M160 Bilateral primary osteoarthritis of hip: Secondary | ICD-10-CM | POA: Diagnosis not present

## 2022-05-04 DIAGNOSIS — I878 Other specified disorders of veins: Secondary | ICD-10-CM | POA: Diagnosis not present

## 2022-05-04 DIAGNOSIS — E041 Nontoxic single thyroid nodule: Secondary | ICD-10-CM | POA: Diagnosis not present

## 2022-05-04 DIAGNOSIS — M25519 Pain in unspecified shoulder: Secondary | ICD-10-CM

## 2022-05-04 DIAGNOSIS — M461 Sacroiliitis, not elsewhere classified: Secondary | ICD-10-CM | POA: Diagnosis not present

## 2022-05-04 DIAGNOSIS — D497 Neoplasm of unspecified behavior of endocrine glands and other parts of nervous system: Secondary | ICD-10-CM

## 2022-05-04 DIAGNOSIS — R69 Illness, unspecified: Secondary | ICD-10-CM | POA: Diagnosis not present

## 2022-05-04 DIAGNOSIS — M19011 Primary osteoarthritis, right shoulder: Secondary | ICD-10-CM | POA: Diagnosis not present

## 2022-05-04 LAB — CBC WITH DIFFERENTIAL/PLATELET
Abs Immature Granulocytes: 0.03 10*3/uL (ref 0.00–0.07)
Basophils Absolute: 0 10*3/uL (ref 0.0–0.1)
Basophils Relative: 1 %
Eosinophils Absolute: 0.1 10*3/uL (ref 0.0–0.5)
Eosinophils Relative: 1 %
HCT: 46.3 % — ABNORMAL HIGH (ref 36.0–46.0)
Hemoglobin: 15 g/dL (ref 12.0–15.0)
Immature Granulocytes: 0 %
Lymphocytes Relative: 32 %
Lymphs Abs: 2.7 10*3/uL (ref 0.7–4.0)
MCH: 30.1 pg (ref 26.0–34.0)
MCHC: 32.4 g/dL (ref 30.0–36.0)
MCV: 93 fL (ref 80.0–100.0)
Monocytes Absolute: 0.5 10*3/uL (ref 0.1–1.0)
Monocytes Relative: 6 %
Neutro Abs: 5 10*3/uL (ref 1.7–7.7)
Neutrophils Relative %: 60 %
Platelets: 306 10*3/uL (ref 150–400)
RBC: 4.98 MIL/uL (ref 3.87–5.11)
RDW: 13.2 % (ref 11.5–15.5)
WBC: 8.3 10*3/uL (ref 4.0–10.5)
nRBC: 0 % (ref 0.0–0.2)

## 2022-05-04 LAB — COMPREHENSIVE METABOLIC PANEL
ALT: 21 U/L (ref 0–44)
AST: 20 U/L (ref 15–41)
Albumin: 4 g/dL (ref 3.5–5.0)
Alkaline Phosphatase: 86 U/L (ref 38–126)
Anion gap: 6 (ref 5–15)
BUN: 11 mg/dL (ref 6–20)
CO2: 27 mmol/L (ref 22–32)
Calcium: 9.5 mg/dL (ref 8.9–10.3)
Chloride: 104 mmol/L (ref 98–111)
Creatinine, Ser: 0.58 mg/dL (ref 0.44–1.00)
GFR, Estimated: >60 mL/min (ref >60)
Glucose, Bld: 109 mg/dL — ABNORMAL HIGH (ref 70–99)
Potassium: 4 mmol/L (ref 3.5–5.1)
Sodium: 137 mmol/L (ref 135–145)
Total Bilirubin: 0.6 mg/dL (ref 0.3–1.2)
Total Protein: 8.3 g/dL — ABNORMAL HIGH (ref 6.5–8.1)

## 2022-05-04 LAB — I-STAT BETA HCG BLOOD, ED (MC, WL, AP ONLY): I-stat hCG, quantitative: <5 m[IU]/mL (ref <5)

## 2022-05-04 LAB — TROPONIN I (HIGH SENSITIVITY)
Troponin I (High Sensitivity): 3 ng/L (ref <18)
Troponin I (High Sensitivity): 3 ng/L (ref <18)

## 2022-05-04 LAB — LIPASE, BLOOD: Lipase: 28 U/L (ref 11–51)

## 2022-05-04 MED ORDER — MORPHINE SULFATE (PF) 4 MG/ML IV SOLN
4.0000 mg | Freq: Once | INTRAVENOUS | Status: AC
  Administered 2022-05-04: 4 mg via INTRAVENOUS
  Filled 2022-05-04: qty 1

## 2022-05-04 MED ORDER — CELECOXIB 200 MG PO CAPS: 200.0000 mg | ORAL_CAPSULE | Freq: Two times a day (BID) | ORAL | 0 refills | Status: DC

## 2022-05-04 MED ORDER — ONDANSETRON HCL 4 MG/2ML IJ SOLN
4.0000 mg | Freq: Once | INTRAMUSCULAR | Status: AC
  Administered 2022-05-04: 4 mg via INTRAVENOUS
  Filled 2022-05-04: qty 2

## 2022-05-04 MED ORDER — OXYCODONE-ACETAMINOPHEN 5-325 MG PO TABS
2.0000 | ORAL_TABLET | Freq: Once | ORAL | Status: AC
  Administered 2022-05-04: 2 via ORAL
  Filled 2022-05-04: qty 2

## 2022-05-04 MED ORDER — CYCLOBENZAPRINE HCL 10 MG PO TABS: 5.0000 mg | ORAL_TABLET | Freq: Two times a day (BID) | ORAL | 0 refills | Status: DC | PRN

## 2022-05-04 MED ORDER — IOHEXOL 350 MG/ML SOLN
100.0000 mL | Freq: Once | INTRAVENOUS | Status: AC | PRN
  Administered 2022-05-04: 100 mL via INTRAVENOUS

## 2022-05-04 NOTE — ED Triage Notes (Signed)
BIBA Pt reports bilateral shoulder pain & left sided neck pain. Pt reports pain 10/10.

## 2022-05-04 NOTE — ED Provider Notes (Signed)
R/o dissection study.  + neck and shoulder pain, R side. + htn   I have visualized and interpreted CT angiogram chest abdomen pelvis which showed no acute findings.  She does have some degenerative changes and an incidental 1.9 cm thyroid nodule.  I entered to the room and patient was sleeping in the reclining chair.  I tried to arouse the patient 3 times.  Because she would not stay awake or keep her eyes open I did place my hand under her arm to try to have her sit up as I was attempting to move the chair.  Patient abruptly awoke and yelled at me not to touch her.  Removed my hand and told her I was just trying to help her wake up because she was not staying awake and had been in the room for 3 minutes yelling at her. Patient then sat up.  We discussed the CT findings.  I discussed the fact that she did not appear to have any acute abnormalities.  I discussed her degenerative changes and I discussed her incidental thyroid nodule.  Patient states she does not have a PCP.  I referred her to the 800-number in the AVS which she may call for PCP referral.  I discussed the fact that she would need an outpatient thyroid ultrasound.  She understands.  I then told the patient that she would be discharged and I would be sending in some pain medication to her pharmacy.  5:28 PM I was called back to the patient room at discharge because patient was unhappy with the medications I prescribed.  I asked the patient if she had tried that medication before and she stated it did not matter because she knew it would not help her. I asked the patient 3 times what medication she thought might help her however patient's told me that I was "trying to be cute" and that she was in a doctor and I was supposed to note.  I suggested that given the fact that there were no emergent findings she could perhaps try this medication.  Patient would not let me get a word in edge wise and told me I could leave the room of abruptly.  I then  stepped out and let the patient continue to be discharged.  I did offer the patient medications 3 times however given the fact that she was angry and would not let me speak or further explain the situation and I did politely asks 3 times what medication she thought might help, I could no longer continue to have a discussion with the patient as she was irate and not allowing any dialogue.   Margarita Mail, PA-C 05/04/22 [place], [place], DO 05/05/22 [phone]

## 2022-05-04 NOTE — Discharge Instructions (Addendum)
1) there is an incidental finding of a 1.9 cm thyroid nodule.  You will need an outpatient ultrasound of your thyroid.  Please make sure to follow-up with a primary care physician regarding this so they can get you scheduled.  2) call the orthopedic specialist as soon as possible to set up an appointment for your shoulder pain.  Get help right away if: Your arm, hand, or fingers: Tingle. Become numb. Become swollen. Become painful. Turn white or blue.

## 2022-05-04 NOTE — ED Provider Notes (Signed)
[facility] DEPT Provider Note   CSN: [id number] Arrival date & time: 05/04/22  1120     History  Chief Complaint  Patient presents with   Neck Pain   Shoulder Pain    Tamara Cline is a 46 y.o. female.  The history is provided by the patient and medical records. No language interpreter was used.  Neck Pain Shoulder Pain Associated symptoms: neck pain      46 year old female with history of hypertension and asthma presenting with right shoulder pain.  She endorsed pain to her right shoulder down to her right arm since yesterday. Pain also radiates to her chest.  Report tobacco use. Pain has been persistent more noticeable with movement but present at rest.  No associated fever or chills no lightheadedness or dizziness no chest pain shortness of breath no numbness.  She denies any trauma.  She has never had this pain before.  She has right arm dominant.  History of hypertension not taking her blood pressure medication.  Home Medications Prior to Admission medications   Medication Sig Start Date End Date Taking? Authorizing Provider  amLODipine (NORVASC) 5 MG tablet Take 1 tablet (5 mg total) by mouth daily. Patient not taking: Reported on 03/11/2021 02/01/21   Raspet, Derry Skill, PA-C  esomeprazole (NEXIUM) 40 MG capsule Take 40 mg by mouth daily at 12 noon.    [provider]  naproxen (NAPROSYN) 500 MG tablet Take 1 tablet (500 mg total) by mouth 3 (three) times daily with meals. 03/11/21   Kerin Perna, NP      Allergies    Patient has no known allergies.    Review of Systems   Review of Systems  Musculoskeletal:  Positive for neck pain.  All other systems reviewed and are negative.   Physical Exam Updated Vital Signs BP (!) 191/106   Pulse 91   Temp 98.1 F (36.7 C) (Oral)   Resp 19   SpO2 97%  Physical Exam Vitals and nursing note reviewed.  Constitutional:      General: She is in acute distress.     Appearance: She is  well-developed. She is obese.  HENT:     Head: Atraumatic.  Eyes:     Conjunctiva/sclera: Conjunctivae normal.  Cardiovascular:     Rate and Rhythm: Normal rate and regular rhythm.     Pulses: Normal pulses.     Heart sounds: Normal heart sounds.  Pulmonary:     Effort: Pulmonary effort is normal.  Chest:     Chest wall: No tenderness.  Abdominal:     Palpations: Abdomen is soft.  Musculoskeletal:        General: Tenderness (Right shoulder: Tenderness to posterior scapular region no shoulder deformity no overlying skin changes.) present.     Cervical back: Neck supple.  Skin:    Findings: No rash.  Neurological:     Mental Status: She is alert.  Psychiatric:        Mood and Affect: Mood normal.    ED Results / Procedures / Treatments   Labs (all labs ordered are listed, but only abnormal results are displayed) Labs Reviewed  CBC WITH DIFFERENTIAL/PLATELET - Abnormal; Notable for the following components:      Result Value   HCT 46.3 (*)    All other components within normal limits  COMPREHENSIVE METABOLIC PANEL - Abnormal; Notable for the following components:   Glucose, Bld 109 (*)    Total Protein 8.3 (*)  All other components within normal limits  LIPASE, BLOOD  I-STAT BETA HCG BLOOD, ED ([facility], [facility], [facility] ONLY)  TROPONIN I (HIGH SENSITIVITY)    EKG None  Date: 05/04/2022  Rate: 74  Rhythm: normal sinus rhythm  QRS Axis: normal  Intervals: normal  ST/T Wave abnormalities: normal  Conduction Disutrbances: none  Narrative Interpretation:   Old EKG Reviewed: No significant changes noted    Radiology No results found.  Procedures Procedures    Medications Ordered in ED Medications  oxyCODONE-acetaminophen (PERCOCET/ROXICET) 5-325 MG per tablet 2 tablet (2 tablets Oral Given 05/04/22 1339)  morphine (PF) 4 MG/ML injection 4 mg (4 mg Intravenous Given 05/04/22 1359)  ondansetron (ZOFRAN) injection 4 mg (4 mg Intravenous Given 05/04/22 1400)    ED Course/  Medical Decision Making/ A&P Clinical Course as of 05/04/22 1353  Thu May 04, 2022  1341 46YOF with shoulder and arm pain. Concern for MSK Hypertension noncompliant.  [CC]  6295 I personally reevaluated this patient at bedside.  She is having uncontrolled right shoulder pain radiating into her back.  She feels it radiating throughout her chest.  She has a history of hypertension but does not take her medications because she cannot afford them, she smokes daily.  She endorses a family history of heart disease. She denies fevers chills nausea vomiting, syncope or shortness of breath. Given patient's body habitus, it is difficult to localize her symptoms, have considered aortic dissection, pulmonary embolism, ACS.  These seem grossly less likely but given her risk factors, she warrants evaluation of all the above, we will proceed with cross-sectional imaging angiography of the chest abdomen pelvis to evaluate for vascular disease while proceeding with delta troponin and EKG to evaluate for ACS. If all results are reassuring, patient's symptoms are improving, she may be a candidate for outpatient follow-up with cardiology. [CC]    Clinical Course User Index [CC] Tretha Sciara, MD                           Medical Decision Making Amount and/or Complexity of Data Reviewed Labs: ordered. Radiology: ordered.  Risk Prescription drug management.   BP (!) 191/106   Pulse 91   Temp 98.1 F (36.7 C) (Oral)   Resp 19   SpO2 97%   9:20 PM 46 year old female with significant history of hypertension, asthma, who presents complaining of arm pain.  Patient endorses pain to her right arm and shoulder that started yesterday.  Pain brought on without any provocation, has been persistent, and present at rest and with movement.  Nothing seems to make the pain better or worse.  Pain does radiates to her chest. No complaints of lightheadedness or dizziness no trouble breathing no significant fever or chills.   She denies any trauma.  She denies any arm weakness or arm numbness.  She has never had this problem before.  On exam this is a 46 year old obese female appears uncomfortable actively crying.  She has tenderness about her right scapular region on palpation maintaining normal shoulder range of motion without any arm weakness.  Radial pulse 2+.  Brisk cap refill.  Normal grip strength.  Heart lung sounds normal.  No significant tenderness about her neck.  No deformity about the shoulder to suggest dislocation.  Vital signs remarkable for blood pressure of 191/106.  She is afebrile and no hypoxia.  Patient was given 2 Percocets. Given atraumatic arm pain radiates to chest and strong family hx of  cardiac disease, and pt has hx of tobacco use, I have ordered a dissection study along with appropriate labs, EKG.  I discussed care with Dr. Oswald Hillock.    2:54 PM Labs obtained independently viewed interpreted by me.  Pregnancy test is negative, normal WBC, normal H&H, electrolyte panels are reassuring, normal lipase, initial troponin is normal  3:15 PM Pt sign out to oncoming team who will f/u on CT result and determine disposition  This patient presents to the ED for concern of arm pain, this involves an extensive number of treatment options, and is a complaint that carries with it a high risk of complications and morbidity.  The differential diagnosis includes MSK pain, dissection, acs, radiculopathy  Co morbidities that complicate the patient evaluation obesity  Tobacco use  HTN Additional history obtained:  Additional history obtained from patient External records from outside source obtained and reviewed including EMR  Lab Tests:  I Ordered, and personally interpreted labs.  The pertinent results include:  as above  Imaging Studies ordered:  I ordered imaging studies including dissection study I independently visualized and interpreted imaging which showed pending result I agree with the  radiologist interpretation  Cardiac Monitoring:  The patient was maintained on a cardiac monitor.  I personally viewed and interpreted the cardiac monitored which showed an underlying rhythm of: NSR  Medicines ordered and prescription drug management:  I ordered medication including morphine  for shoulder pain Reevaluation of the patient after these medicines showed that the patient improved I have reviewed the patients home medicines and have made adjustments as needed  Test Considered: as above  Critical Interventions: IV opiate  Consultations Obtained:  I requested consultation with the attending Dr. Oswald Hillock,  and discussed lab and imaging findings as well as pertinent plan - they recommend: dissection study  Problem List / ED Course: R shoulder pain  Chest pain  Reevaluation:  After the interventions noted above, I reevaluated the patient and found that they have :improved  Social Determinants of Health: tobacco use  Dispostion:  After consideration of the diagnostic results and the patients response to treatment, I feel that the patent would benefit from further evaluation and disposition determine by next provider.         Final Clinical Impression(s) / ED Diagnoses Final diagnoses:  None    Rx / DC Orders ED Discharge Orders     None         Domenic Moras, PA-C 05/04/22 [place], MD 05/05/22 [phone]

## 2022-05-06 ENCOUNTER — Ambulatory Visit (HOSPITAL_COMMUNITY)
Admission: EM | Admit: 2022-05-06 | Discharge: 2022-05-06 | Disposition: A | Discharge status: Home / Self Care | Payer: 59 | Attending: Internal Medicine | Admitting: Internal Medicine

## 2022-05-06 ENCOUNTER — Encounter (HOSPITAL_COMMUNITY): Payer: Self-pay

## 2022-05-06 DIAGNOSIS — M25551 Pain in right hip: Secondary | ICD-10-CM | POA: Diagnosis not present

## 2022-05-06 DIAGNOSIS — M25511 Pain in right shoulder: Secondary | ICD-10-CM | POA: Diagnosis not present

## 2022-05-06 MED ORDER — METHYLPREDNISOLONE SODIUM SUCC 125 MG IJ SOLR
INTRAMUSCULAR | Status: AC
  Filled 2022-05-06: qty 2

## 2022-05-06 MED ORDER — PREDNISONE 20 MG PO TABS: 40.0000 mg | ORAL_TABLET | Freq: Every day | ORAL | 0 refills | Status: AC

## 2022-05-06 MED ORDER — METHYLPREDNISOLONE SODIUM SUCC 125 MG IJ SOLR
80.0000 mg | Freq: Once | INTRAMUSCULAR | Status: AC
  Administered 2022-05-06: 80 mg via INTRAMUSCULAR

## 2022-05-06 NOTE — ED Triage Notes (Signed)
Patient with c/o right hip pain and shoulder pain. States she is so much pain she cannot sleep. She was seen at [facility] and they did a CT of her abdomen.

## 2022-05-06 NOTE — Discharge Instructions (Addendum)
You were given a steroid injection in the clinic today to help with your hip and shoulder pain today.   Do not use any ibuprofen or naproxen while taking steroid medication.  Start taking '40mg'$  prednisone once daily for the next 5 days with breakfast. Take this with food to avoid stomach upset.  Call the orthopedic doctor on your paperwork to schedule an appointment for further evaluation.  If you develop any new or worsening symptoms or do not improve in the next 2 to 3 days, please return.  If your symptoms are severe, please go to the emergency room.  Follow-up with your primary care provider for further evaluation and management of your symptoms as well as ongoing wellness visits.  I hope you feel better!
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
## 2022-05-10 ENCOUNTER — Ambulatory Visit: Payer: Self-pay | Admitting: *Deleted

## 2022-05-10 NOTE — Telephone Encounter (Signed)
Reason for Disposition  Arm pain is a chronic symptom (recurrent or ongoing AND present > 4 weeks)    And right hip pain and right  arm pain  Answer Assessment - Initial Assessment Questions 1. ONSET: "When did the pain start?"     My arm and hip on right side.    I fell going into a [employer].   It's been a while back.   I went to the ED.   I need a hip replacement.   I'm not able to walk to the bus stop or into dr. Kendrick Fries.   I'm needing paperwork so I can ride [employer].  I'm trying to get a PCP.   I had a PCP her name was Juluis Mire, NP.   I had to leave [facility].  Now I'm back.  I went to [facility] ED.   I got an apt. With a bone dr on 05/18/2022.     I was able to schedule her with Juluis Mire, NP with [facility] because it had only  been a year since she was there last per pt.   05/15/2022 at 10:10.    She's going to try and get someone to take her to that appt.   I need papers so I can use [employer] for transportation.    I can't walk that far.   2. LOCATION: "Where is the pain located?"     Right Arm and hip from a fall a while back.   They've already told me I need a hip replacement.  3. PAIN: "How bad is the pain?" (Scale 1-10; or mild, moderate, severe)   - MILD (1-3): Doesn't interfere with normal activities.   - MODERATE (4-7): Interferes with normal activities (e.g., work or school) or awakens from sleep.   - SEVERE (8-10): Excruciating pain, unable to do any normal activities, unable to hold a cup of water.     Moderate making it difficult to ambulate 4. WORK OR EXERCISE: "Has there been any recent work or exercise that involved this part of the body?"     No 5. CAUSE: "What do you think is causing the arm pain?"     See above.   Needs to re establish with PCP. 6. OTHER SYMPTOMS: "Do you have any other symptoms?" (e.g., neck pain, swelling, rash, fever, numbness, weakness)     No 7. PREGNANCY: "Is there any chance you are pregnant?" "When was your last  menstrual period?"     Not asked  Protocols used: Arm Pain-A-AH

## 2022-05-10 NOTE — Telephone Encounter (Signed)
  Chief Complaint: Needs to re establish with PCP.   Needs paperwork so she can use [employer].  Has injuries to right arm and hip from a prior fall a while back.   Not a new problem.   Needs hip replacement.   Symptoms:  Frequency:  Pertinent Negatives: Patient denies  Disposition: '[]'$ ED /'[]'$ Urgent Care (no appt availability in office) / '[]'$ Appointment(In office/virtual)/ '[]'$  [facility]/ '[]'$ Home Care/ '[]'$ Refused Recommended Disposition /'[]'$ [facility]/ '[x]'$  Follow-up with PCP Additional Notes: Pt scheduled.

## 2022-05-15 ENCOUNTER — Telehealth (INDEPENDENT_AMBULATORY_CARE_PROVIDER_SITE_OTHER): Payer: Self-pay | Admitting: Primary Care

## 2022-05-15 ENCOUNTER — Encounter (INDEPENDENT_AMBULATORY_CARE_PROVIDER_SITE_OTHER): Payer: 59 | Admitting: Primary Care

## 2022-05-15 ENCOUNTER — Encounter (INDEPENDENT_AMBULATORY_CARE_PROVIDER_SITE_OTHER): Payer: Self-pay | Admitting: Primary Care

## 2022-05-15 ENCOUNTER — Ambulatory Visit (INDEPENDENT_AMBULATORY_CARE_PROVIDER_SITE_OTHER): Payer: 59 | Admitting: Primary Care

## 2022-05-15 VITALS — BP 112/83 | HR 99 | Resp 16 | Ht 65.0 in | Wt 346.9 lb

## 2022-05-15 DIAGNOSIS — G8929 Other chronic pain: Secondary | ICD-10-CM

## 2022-05-15 DIAGNOSIS — Z13220 Encounter for screening for lipoid disorders: Secondary | ICD-10-CM

## 2022-05-15 DIAGNOSIS — Z131 Encounter for screening for diabetes mellitus: Secondary | ICD-10-CM

## 2022-05-15 DIAGNOSIS — Z0000 Encounter for general adult medical examination without abnormal findings: Secondary | ICD-10-CM

## 2022-05-15 DIAGNOSIS — M25511 Pain in right shoulder: Secondary | ICD-10-CM

## 2022-05-15 DIAGNOSIS — M25551 Pain in right hip: Secondary | ICD-10-CM

## 2022-05-15 MED ORDER — KETOROLAC TROMETHAMINE 60 MG/2ML IM SOLN: 60.0000 mg | Freq: Once | INTRAMUSCULAR | Status: AC

## 2022-05-15 NOTE — Telephone Encounter (Signed)
Pt is calling to report that she is unable to come to appt today because she does not have a ride. Pt would like Sharyn Lull to complete [employer] paperwork. Was advised that Sharyn Lull had the paperwork in the office. Please advise

## 2022-05-15 NOTE — Patient Instructions (Signed)
Calorie Counting for Weight Loss Calories are units of energy. Your body needs a certain number of calories from food to keep going throughout the day. When you eat or drink more calories than your body needs, your body stores the extra calories mostly as fat. When you eat or drink fewer calories than your body needs, your body burns fat to get the energy it needs. Calorie counting means keeping track of how many calories you eat and drink each day. Calorie counting can be helpful if you need to lose weight. If you eat fewer calories than your body needs, you should lose weight. Ask your health care provider what a healthy weight is for you. For calorie counting to work, you will need to eat the right number of calories each day to lose a healthy amount of weight per week. A dietitian can help you figure out how many calories you need in a day and will suggest ways to reach your calorie goal. A healthy amount of weight to lose each week is usually 1-2 lb (0.5-0.9 kg). This usually means that your daily calorie intake should be reduced by 500-750 calories. Eating 1,200-1,500 calories a day can help most women lose weight. Eating 1,500-1,800 calories a day can help most men lose weight. What do I need to know about calorie counting? Work with your health care provider or dietitian to determine how many calories you should get each day. To meet your daily calorie goal, you will need to: Find out how many calories are in each food that you would like to eat. Try to do this before you eat. Decide how much of the food you plan to eat. Keep a food log. Do this by writing down what you ate and how many calories it had. To successfully lose weight, it is important to balance calorie counting with a healthy lifestyle that includes regular activity. Where do I find calorie information?  The number of calories in a food can be found on a Nutrition Facts label. If a food does not have a Nutrition Facts label, try  to look up the calories online or ask your dietitian for help. Remember that calories are listed per serving. If you choose to have more than one serving of a food, you will have to multiply the calories per serving by the number of servings you plan to eat. For example, the label on a package of bread might say that a serving size is 1 slice and that there are 90 calories in a serving. If you eat 1 slice, you will have eaten 90 calories. If you eat 2 slices, you will have eaten 180 calories. How do I keep a food log? After each time that you eat, record the following in your food log as soon as possible: What you ate. Be sure to include toppings, sauces, and other extras on the food. How much you ate. This can be measured in cups, ounces, or number of items. How many calories were in each food and drink. The total number of calories in the food you ate. Keep your food log near you, such as in a pocket-sized notebook or on an app or website on your mobile phone. Some programs will calculate calories for you and show you how many calories you have left to meet your daily goal. What are some portion-control tips? Know how many calories are in a serving. This will help you know how many servings you can have of a certain   food. Use a measuring cup to measure serving sizes. You could also try weighing out portions on a kitchen scale. With time, you will be able to estimate serving sizes for some foods. Take time to put servings of different foods on your favorite plates or in your favorite bowls and cups so you know what a serving looks like. Try not to eat straight from a food's packaging, such as from a bag or box. Eating straight from the package makes it hard to see how much you are eating and can lead to overeating. Put the amount you would like to eat in a cup or on a plate to make sure you are eating the right portion. Use smaller plates, glasses, and bowls for smaller portions and to prevent  overeating. Try not to multitask. For example, avoid watching TV or using your computer while eating. If it is time to eat, sit down at a table and enjoy your food. This will help you recognize when you are full. It will also help you be more mindful of what and how much you are eating. What are tips for following this plan? Reading food labels Check the calorie count compared with the serving size. The serving size may be smaller than what you are used to eating. Check the source of the calories. Try to choose foods that are high in protein, fiber, and vitamins, and low in saturated fat, trans fat, and sodium. Shopping Read nutrition labels while you shop. This will help you make healthy decisions about which foods to buy. Pay attention to nutrition labels for low-fat or fat-free foods. These foods sometimes have the same number of calories or more calories than the full-fat versions. They also often have added sugar, starch, or salt to make up for flavor that was removed with the fat. Make a grocery list of lower-calorie foods and stick to it. Cooking Try to cook your favorite foods in a healthier way. For example, try baking instead of frying. Use low-fat dairy products. Meal planning Use more fruits and vegetables. One-half of your plate should be fruits and vegetables. Include lean proteins, such as chicken, turkey, and fish. Lifestyle Each week, aim to do one of the following: 150 minutes of moderate exercise, such as walking. 75 minutes of vigorous exercise, such as running. General information Know how many calories are in the foods you eat most often. This will help you calculate calorie counts faster. Find a way of tracking calories that works for you. Get creative. Try different apps or programs if writing down calories does not work for you. What foods should I eat?  Eat nutritious foods. It is better to have a nutritious, high-calorie food, such as an avocado, than a food with  few nutrients, such as a bag of potato chips. Use your calories on foods and drinks that will fill you up and will not leave you hungry soon after eating. Examples of foods that fill you up are nuts and nut butters, vegetables, lean proteins, and high-fiber foods such as whole grains. High-fiber foods are foods with more than 5 g of fiber per serving. Pay attention to calories in drinks. Low-calorie drinks include water and unsweetened drinks. The items listed above may not be a complete list of foods and beverages you can eat. Contact a dietitian for more information. What foods should I limit? Limit foods or drinks that are not good sources of vitamins, minerals, or protein or that are high in unhealthy fats. These   include: Candy. Other sweets. Sodas, specialty coffee drinks, alcohol, and juice. The items listed above may not be a complete list of foods and beverages you should avoid. Contact a dietitian for more information. How do I count calories when eating out? Pay attention to portions. Often, portions are much larger when eating out. Try these tips to keep portions smaller: Consider sharing a meal instead of getting your own. If you get your own meal, eat only half of it. Before you start eating, ask for a container and put half of your meal into it. When available, consider ordering smaller portions from the menu instead of full portions. Pay attention to your food and drink choices. Knowing the way food is cooked and what is included with the meal can help you eat fewer calories. If calories are listed on the menu, choose the lower-calorie options. Choose dishes that include vegetables, fruits, whole grains, low-fat dairy products, and lean proteins. Choose items that are boiled, broiled, grilled, or steamed. Avoid items that are buttered, battered, fried, or served with cream sauce. Items labeled as crispy are usually fried, unless stated otherwise. Choose water, low-fat milk,  unsweetened iced tea, or other drinks without added sugar. If you want an alcoholic beverage, choose a lower-calorie option, such as a glass of wine or light beer. Ask for dressings, sauces, and syrups on the side. These are usually high in calories, so you should limit the amount you eat. If you want a salad, choose a garden salad and ask for grilled meats. Avoid extra toppings such as bacon, cheese, or fried items. Ask for the dressing on the side, or ask for olive oil and vinegar or lemon to use as dressing. Estimate how many servings of a food you are given. Knowing serving sizes will help you be aware of how much food you are eating at restaurants. Where to find more information Centers for Disease Control and Prevention: www.cdc.gov U.[place]. Department of Agriculture: myplate.gov Summary Calorie counting means keeping track of how many calories you eat and drink each day. If you eat fewer calories than your body needs, you should lose weight. A healthy amount of weight to lose per week is usually 1-2 lb (0.5-0.9 kg). This usually means reducing your daily calorie intake by 500-750 calories. The number of calories in a food can be found on a Nutrition Facts label. If a food does not have a Nutrition Facts label, try to look up the calories online or ask your dietitian for help. Use smaller plates, glasses, and bowls for smaller portions and to prevent overeating. Use your calories on foods and drinks that will fill you up and not leave you hungry shortly after a meal. This information is not intended to replace advice given to you by your health care provider. Make sure you discuss any questions you have with your health care provider. Document Revised: 09/04/2019 Document Reviewed: 09/04/2019 Elsevier Patient Education  2023 Elsevier Inc.  

## 2022-05-15 NOTE — Progress Notes (Unsigned)
Pt is needing scat paperwork

## 2022-05-15 NOTE — Telephone Encounter (Signed)
Pt has an appt at 330

## 2022-05-16 ENCOUNTER — Other Ambulatory Visit (INDEPENDENT_AMBULATORY_CARE_PROVIDER_SITE_OTHER): Payer: Self-pay | Admitting: Primary Care

## 2022-05-16 LAB — LIPID PANEL
Chol/HDL Ratio: 4.7 ratio — ABNORMAL HIGH (ref 0.0–4.4)
Cholesterol, Total: 211 mg/dL — ABNORMAL HIGH (ref 100–199)
HDL: 45 mg/dL (ref >39)
LDL Chol Calc (NIH): 148 mg/dL — ABNORMAL HIGH (ref 0–99)
Triglycerides: 99 mg/dL (ref 0–149)
VLDL Cholesterol Cal: 18 mg/dL (ref 5–40)

## 2022-05-16 LAB — HIV ANTIBODY (ROUTINE TESTING W REFLEX): HIV Screen 4th Generation wRfx: NONREACTIVE

## 2022-05-16 LAB — HEMOGLOBIN A1C
Est. average glucose Bld gHb Est-mCnc: 120 mg/dL
Hgb A1c MFr Bld: 5.8 % — ABNORMAL HIGH (ref 4.8–5.6)

## 2022-05-16 LAB — HCV AB W REFLEX TO QUANT PCR: HCV Ab: NONREACTIVE

## 2022-05-16 LAB — HCV INTERPRETATION

## 2022-05-16 MED ORDER — ROSUVASTATIN CALCIUM 40 MG PO TABS: 40.0000 mg | ORAL_TABLET | Freq: Every day | ORAL | 3 refills | Status: DC

## 2022-05-17 ENCOUNTER — Telehealth (INDEPENDENT_AMBULATORY_CARE_PROVIDER_SITE_OTHER): Payer: Self-pay

## 2022-05-17 NOTE — Telephone Encounter (Signed)
Contacted pt to go over lab results pt is aware and doesn't have any questions or concerns 

## 2022-05-17 NOTE — Progress Notes (Signed)
[place]  [name] is a 46 y.o. severe morbid obese diabetes female presents to office today for annual physical exam examination.    Concerns today include: 1.  Hypertension  Occupation: [employer], Marital status: Single, Substance use: No Diet: Regular, Exercise: Walking  Health Maintenance  Topic Date Due   COVID-19 Vaccine (1) Never done   PAP SMEAR-Modifier  Never done   COLONOSCOPY (Pts 45-50yr Insurance coverage will need to be confirmed)  Never done   INFLUENZA VACCINE  11/05/2022 (Originally 03/07/2022)   TETANUS/TDAP  05/16/2023 (Originally 01/28/1993)   Hepatitis C Screening  Completed   HIV Screening  Completed   HPV VACCINES  Aged Out     Past Medical History:  Diagnosis Date   Asthma    Social History   Socioeconomic History   Marital status: Divorced    Spouse name: Not on file   Number of children: Not on file   Years of education: Not on file   Highest education level: Not on file  Occupational History   Not on file  Tobacco Use   Smoking status: Some Days    Types: Cigarettes   Smokeless tobacco: Never  Vaping Use   Vaping Use: Never used  Substance and Sexual Activity   Alcohol use: Yes    Comment: Occasionally    Drug use: Yes    Types: Cocaine   Sexual activity: Yes    Birth control/protection: None  Other Topics Concern   Not on file  Social History Narrative   Not on file   Social Determinants of Health   Financial Resource Strain: Not on file  Food Insecurity: Not on file  Transportation Needs: Not on file  Physical Activity: Not on file  Stress: Not on file  Social Connections: Not on file  Intimate Partner Violence: Not on file   Past Surgical History:  Procedure Laterality Date   TONSILLECTOMY     TUBAL LIGATION     Family History  Problem Relation Age of Onset   Diabetes Mother    Hypertension Mother    Diabetes Father    Hypertension Father     Current Outpatient Medications:     rosuvastatin (CRESTOR) 40 MG tablet, Take 1 tablet (40 mg total) by mouth daily., Disp: 90 tablet, Rfl: 3  Current Facility-Administered Medications:    ketorolac (TORADOL) injection 60 mg, 60 mg, Intramuscular, Once, EKerin Perna NP Outpatient Encounter Medications as of 05/15/2022  Medication Sig   [DISCONTINUED] amLODipine (NORVASC) 5 MG tablet Take 1 tablet (5 mg total) by mouth daily. (Patient not taking: Reported on 03/11/2021)   [DISCONTINUED] celecoxib (CELEBREX) 200 MG capsule Take 1 capsule (200 mg total) by mouth 2 (two) times daily. (Patient not taking: Reported on 05/15/2022)   [DISCONTINUED] cyclobenzaprine (FLEXERIL) 10 MG tablet Take 0.5-1 tablets (5-10 mg total) by mouth 2 (two) times daily as needed for muscle spasms. (Patient not taking: Reported on 05/15/2022)   [DISCONTINUED] esomeprazole (NEXIUM) 40 MG capsule Take 40 mg by mouth daily at 12 noon. (Patient not taking: Reported on 05/15/2022)   [DISCONTINUED] naproxen (NAPROSYN) 500 MG tablet Take 1 tablet (500 mg total) by mouth 3 (three) times daily with meals. (Patient not taking: Reported on 05/15/2022)   Facility-Administered Encounter Medications as of 05/15/2022  Medication   ketorolac (TORADOL) injection 60 mg    No Known Allergies   ROS: Review of Systems Pertinent items noted in HPI and remainder of comprehensive ROS otherwise negative.  Physical exam: General: Vital signs reviewed.  Patient is well-developed and well-nourished, severe morbid obesity in no acute distress and cooperative with exam. Head: Normocephalic and atraumatic. Eyes: EOMI, conjunctivae normal, no scleral icterus. Neck: Supple, trachea midline, normal ROM, no JVD, masses, thyromegaly, or carotid bruit present. Cardiovascular: RRR, S1 normal, S2 normal, no murmurs, gallops, or rubs. Pulmonary/Chest: Clear to auscultation bilaterally, no wheezes, rales, or rhonchi. Abdominal: Soft, non-tender, non-distended, BS +, no masses,  organomegaly, or guarding present. Musculoskeletal: No joint deformities, erythema, or stiffness, ROM full and nontender. Extremities: No lower extremity edema bilaterally,  pulses symmetric and intact bilaterally. No cyanosis or clubbing. Neurological: A&O x3, Strength is normal Skin: Warm, dry and intact. No rashes or erythema. Psychiatric: Normal mood and affect. speech and behavior is normal. Cognition and memory are normal.    Assessment/ Plan: Tamara Cline here for annual physical exam.  Tamara Cline was seen today for hypertension.  Diagnoses and all orders for this visit:  Healthcare maintenance -     HCV Ab w Reflex to Quant PCR -     HIV Antibody (routine testing w rflx) -     Hemoglobin A1c -     Lipid panel  Lipid screening -     Lipid panel  Diabetes mellitus screening -     Hemoglobin A1c  Chronic right shoulder pain Right shoulder pain with crepitus  -     AMB referral to orthopedics -     Ambulatory referral to Physical Therapy -     ketorolac (TORADOL) injection 60 mg  Chronic pain of right hip 2/2 severe morbid obesity  Morbid Obesity is > 40 BMI indicating an excess in caloric intake or underlining conditions. This may have lead to hyperlipidemia and pain in joints lead to other co-morbidities. Educated on lifestyle modifications of diet and exercise which may reduce obesity.   -     AMB referral to orthopedics -     Ambulatory referral to Physical Therapy -     ketorolac (TORADOL) injection 60 mg  Other orders -     Interpretation:  Counseled on healthy lifestyle choices, including diet (rich in fruits, vegetables and lean meats and low in salt and simple carbohydrates) and exercise (at least 30 minutes of moderate physical activity daily).  Patient to follow up in 1 year for annual exam or sooner if needed.  The above assessment and management plan was discussed with the patient. The patient verbalized understanding of and has agreed to the management  plan. Patient is aware to call the clinic if symptoms persist or worsen. Patient is aware when to return to the clinic for a follow-up visit. Patient educated on when it is appropriate to go to the emergency department.   This note has been created with Surveyor, quantity. Any transcriptional errors are unintentional.   Kerin Perna, NP 05/17/2022, 11:18 AM

## 2022-05-18 ENCOUNTER — Ambulatory Visit: Payer: 59 | Admitting: Orthopedic Surgery

## 2022-05-23 ENCOUNTER — Ambulatory Visit: Payer: Self-pay | Attending: Primary Care | Admitting: Physical Therapy

## 2022-05-25 ENCOUNTER — Encounter (INDEPENDENT_AMBULATORY_CARE_PROVIDER_SITE_OTHER): Payer: Self-pay

## 2022-06-07 ENCOUNTER — Encounter: Payer: Self-pay | Admitting: Orthopedic Surgery

## 2022-06-07 ENCOUNTER — Ambulatory Visit (INDEPENDENT_AMBULATORY_CARE_PROVIDER_SITE_OTHER): Payer: Self-pay

## 2022-06-07 ENCOUNTER — Ambulatory Visit (INDEPENDENT_AMBULATORY_CARE_PROVIDER_SITE_OTHER): Payer: Self-pay | Admitting: Orthopedic Surgery

## 2022-06-07 DIAGNOSIS — M25511 Pain in right shoulder: Secondary | ICD-10-CM

## 2022-06-07 DIAGNOSIS — M25551 Pain in right hip: Secondary | ICD-10-CM

## 2022-06-07 DIAGNOSIS — M79601 Pain in right arm: Secondary | ICD-10-CM
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
## 2022-06-13 ENCOUNTER — Ambulatory Visit (INDEPENDENT_AMBULATORY_CARE_PROVIDER_SITE_OTHER): Payer: 59 | Admitting: Primary Care

## 2022-06-20 ENCOUNTER — Telehealth: Payer: Self-pay | Admitting: Orthopedic Surgery

## 2022-06-20 NOTE — Telephone Encounter (Signed)
Called pt 1X and left vm for pt to se an appt with Dr Marlou Sa for MRI Review after 06/25/22

## 2022-06-25 ENCOUNTER — Inpatient Hospital Stay: Admission: RE | Admit: 2022-06-25 | Payer: Self-pay | Source: Ambulatory Visit

## 2022-07-12 ENCOUNTER — Ambulatory Visit (INDEPENDENT_AMBULATORY_CARE_PROVIDER_SITE_OTHER): Payer: 59 | Admitting: Primary Care

## 2022-07-12 ENCOUNTER — Inpatient Hospital Stay: Admission: RE | Admit: 2022-07-12 | Payer: Self-pay | Source: Ambulatory Visit

## 2023-02-23 ENCOUNTER — Ambulatory Visit (INDEPENDENT_AMBULATORY_CARE_PROVIDER_SITE_OTHER): Payer: Self-pay | Admitting: *Deleted

## 2023-02-23 NOTE — Telephone Encounter (Signed)
  Chief Complaint: worsening pain x 2 weeks , knees, hips, low back Symptoms: see above. Reports can hardly move. Walking causes worsening pain. Hx fell on thick concrete 1 year ago and pain getting worse. Taking aleeve but only works 45 minutes to "take edge off". Takes OTC "pm " medication to help sleep due to pain low back pain , hip pain extends to groin and knees.  Frequency: 2 weeks worse pain Pertinent Negatives: Patient denies chest pain , no difficulty breathing no fever, no swelling in knees. No redness  Disposition: [] ED /[x] Urgent Care (no appt availability in office) / [] Appointment(In office/virtual)/ []  [facility]/ [] Home Care/ [] Refused Recommended Disposition /[] [facility]/ []  Follow-up with PCP Additional Notes:   Recommended UC or ED due to pain level severe and no available appt until 03/15/23. Patient requesting referral or MRI . Please advise.     Reason for Disposition  [1] SEVERE pain (e.g., excruciating, unable to walk) AND [2] not improved after 2 hours of pain medicine  Answer Assessment - Initial Assessment Questions 1. LOCATION and RADIATION: "Where is the pain located?"      Bilateral knees, hips pain  2. QUALITY: "What does the pain feel like?"  (e.g., sharp, dull, aching, burning)     Hips pain into groin area. Ache all day  3. SEVERITY: "How bad is the pain?" "What does it keep you from doing?"   (Scale 1-10; or mild, moderate, severe)   -  MILD (1-3): doesn't interfere with normal activities    -  MODERATE (4-7): interferes with normal activities (e.g., work or school) or awakens from sleep, limping    -  SEVERE (8-10): excruciating pain, unable to do any normal activities, unable to walk     Severe, can not sleep  4. ONSET: "When did the pain start?" "Does it come and go, or is it there all the time?"     Worse over 2 weeks  5. RECURRENT: "Have you had this pain before?" If Yes, ask: "When, and what happened then?"     Yes fell x 1  year ago and low back , hips and knees pain "all of the time" 6. SETTING: "Has there been any recent work, exercise or other activity that involved that part of the body?"      Na  7. AGGRAVATING FACTORS: "What makes the knee pain worse?" (e.g., walking, climbing stairs, running)     Movement , walking causes severe pain 8. ASSOCIATED SYMPTOMS: "Is there any swelling or redness of the knee?"     Denies swelling now but does occur in bilateral knees at times  9. OTHER SYMPTOMS: "Do you have any other symptoms?" (e.g., chest pain, difficulty breathing, fever, calf pain)     Hip pain to groin . Low back pain , bilateral knee pain  10. PREGNANCY: "Is there any chance you are pregnant?" "When was your last menstrual period?"       na  Protocols used: Knee Pain-A-AH

## 2023-02-26 NOTE — Telephone Encounter (Signed)
Returned pt call and lvm making pt aware that she can call Ortho to schedule an appt because they have seen her for the same issues. If she has any questions or concerns to give [place] a call

## 2023-03-15 ENCOUNTER — Ambulatory Visit (INDEPENDENT_AMBULATORY_CARE_PROVIDER_SITE_OTHER): Payer: Medicaid Other | Admitting: Primary Care

## 2023-03-26 ENCOUNTER — Ambulatory Visit (INDEPENDENT_AMBULATORY_CARE_PROVIDER_SITE_OTHER): Payer: Medicaid Other | Admitting: Primary Care

## 2023-04-05 ENCOUNTER — Ambulatory Visit (INDEPENDENT_AMBULATORY_CARE_PROVIDER_SITE_OTHER): Payer: 59 | Admitting: Primary Care

## 2023-04-05 VITALS — BP 120/75 | HR 89 | Resp 18 | Ht 65.0 in | Wt 355.4 lb

## 2023-04-05 DIAGNOSIS — M25562 Pain in left knee: Secondary | ICD-10-CM

## 2023-04-05 DIAGNOSIS — G8929 Other chronic pain: Secondary | ICD-10-CM | POA: Diagnosis not present

## 2023-04-05 DIAGNOSIS — M545 Low back pain, unspecified: Secondary | ICD-10-CM | POA: Diagnosis not present

## 2023-04-05 DIAGNOSIS — M25561 Pain in right knee: Secondary | ICD-10-CM

## 2023-04-05 NOTE — Progress Notes (Signed)
  [facility]  Tamara Cline, is a 47 y.o. female  YQM:[id number]  BMW:[id number]  DOB - 1976/03/16  Chief Complaint  Patient presents with   Back Pain    Needs walker, back and knee pain        Subjective:   Tamara Cline is a 47 y.o. female here today for a follow up a acute visit. She is c/o back pain and knee pain requesting a walker to help walking Patient has No headache, Previous visit prediabetes -Denies polyuria, polydipsia, polyphasia or vision changes.  Does not check blood sugars at home.   No problems updated.  No Known Allergies  Past Medical History:  Diagnosis Date   Asthma     Current Outpatient Medications on File Prior to Visit  Medication Sig Dispense Refill   rosuvastatin (CRESTOR) 40 MG tablet Take 1 tablet (40 mg total) by mouth daily. 90 tablet 3   No current facility-administered medications on file prior to visit.    Objective:  BP 120/75 (BP Location: Right Arm, Patient Position: Sitting, Cuff Size: Large)   Pulse 89   Resp 18   Ht 5\' 5"  (1.651 m)   Wt (!) 355 lb 6.4 oz (161.2 kg)   SpO2 96%   BMI 59.14 kg/m    Comprehensive ROS Pertinent positive and negative noted in HPI   Exam General appearance : Awake, alert, not in any distress. Speech Clear. Not toxic looking HEENT: Atraumatic and Normocephalic, pupils equally reactive to light and accomodation Neck: Supple, no JVD. No cervical lymphadenopathy.  Chest: Good air entry bilaterally, no added sounds  CVS: S1 S2 regular, no murmurs.  Abdomen: Bowel sounds present, Non tender and not distended with no gaurding, rigidity or rebound. Extremities: B/L Lower Ext shows no edema, both legs are warm to touch Neurology: Awake alert, and oriented X 3, CN II-XII intact, Non focal Skin: No Rash  Data Review Lab Results  Component Value Date   HGBA1C 5.8 (H) 05/15/2022    Assessment & Plan  Tamara Cline was seen today for back pain and knee pain.  Diagnoses and all  orders for this visit:  Chronic bilateral low back pain without sciatica 2/2 Chronic pain of both knees -     Ambulatory referral to Physical Therap     Patient have been counseled extensively about nutrition and exercise. Other issues discussed during this visit include: low cholesterol diet, weight control and daily exercise, foot care, annual eye examinations at Ophthalmology, importance of adherence with medications and regular follow-up. We also discussed long term complications of uncontrolled diabetes and hypertension.   No follow-ups on file.  The patient was given clear instructions to go to ER or return to medical center if symptoms don't improve, worsen or new problems develop. The patient verbalized understanding. The patient was told to call to get lab results if they haven't heard anything in the next week.   This note has been created with Education officer, environmental. Any transcriptional errors are unintentional.   Grayce Sessions, NP 04/05/2023, 3:15 PM

## 2023-04-19 ENCOUNTER — Telehealth: Payer: Self-pay

## 2023-04-19 ENCOUNTER — Encounter (INDEPENDENT_AMBULATORY_CARE_PROVIDER_SITE_OTHER): Payer: Self-pay | Admitting: Primary Care

## 2023-04-19 ENCOUNTER — Ambulatory Visit (INDEPENDENT_AMBULATORY_CARE_PROVIDER_SITE_OTHER): Payer: 59 | Admitting: Primary Care

## 2023-04-19 VITALS — BP 119/83 | HR 76 | Resp 16 | Wt 347.8 lb

## 2023-04-19 DIAGNOSIS — M545 Low back pain, unspecified: Secondary | ICD-10-CM | POA: Diagnosis not present

## 2023-04-19 DIAGNOSIS — F4323 Adjustment disorder with mixed anxiety and depressed mood: Secondary | ICD-10-CM | POA: Diagnosis not present

## 2023-04-19 DIAGNOSIS — Z5900 Homelessness unspecified: Secondary | ICD-10-CM | POA: Diagnosis not present

## 2023-04-19 DIAGNOSIS — F32A Depression, unspecified: Secondary | ICD-10-CM

## 2023-04-19 DIAGNOSIS — M549 Dorsalgia, unspecified: Secondary | ICD-10-CM | POA: Insufficient documentation

## 2023-04-19 DIAGNOSIS — G8929 Other chronic pain: Secondary | ICD-10-CM | POA: Diagnosis not present

## 2023-04-19 DIAGNOSIS — F419 Anxiety disorder, unspecified: Secondary | ICD-10-CM | POA: Diagnosis not present

## 2023-04-19 NOTE — Telephone Encounter (Signed)
I received a call from Gwinda Passe, NP, who was with the patient, inquiring about housing resources. The patient is currently homeless.   I mentioned going to the [facility] and the patient was adamant that she will not go there.  I explained that the housing resources in this area are very limited but I was able to provide her with the phone numbers for [employer] and [facility].

## 2023-04-19 NOTE — Patient Instructions (Signed)
[facility] [phone] [facility] [phone]

## 2023-04-19 NOTE — Progress Notes (Signed)
[facility]  Tamara Cline, is a 47 y.o. female  EXB:[id number]  NUU:[id number]  DOB - 05-22-1976  Chief Complaint  Patient presents with   Anxiety   Depression       Subjective:   Ms.Annalynne Cline is a 47 y.o.  morbid obesity female here today for a depression.  Walked into the room looked at her face was from she began to cry, I am homeless, is getting ready to get cold, I have no family here, nowhere to go and no help.  Call clinical nurse manager for advice for housing information was given and placed on the AVS.  She has reached out for housing and it cost $25 for the application and a note from her PCP and she has no income.  Last visit she was referred to physical therapy to evaluate and equipment needs for her pain in her back, hip and knee.  She rates her pain as 7/10 takes alleves with no improvement. Patient has No headache, No chest pain, No abdominal pain - No Nausea, No new weakness tingling or numbness, No Cough - shortness of breath  No problems updated.  No Known Allergies  Past Medical History:  Diagnosis Date   Asthma     Current Outpatient Medications on File Prior to Visit  Medication Sig Dispense Refill   rosuvastatin (CRESTOR) 40 MG tablet Take 1 tablet (40 mg total) by mouth daily. (Patient not taking: Reported on 04/05/2023) 90 tablet 3   No current facility-administered medications on file prior to visit.    Objective:   Vitals:   04/19/23 1530  BP: 119/83  Pulse: 76  Resp: 16  SpO2: 98%  Weight: (!) 347 lb 12.8 oz (157.8 kg)    Comprehensive ROS Pertinent positive and negative noted in HPI   Exam General appearance : Awake, alert, not in any distress. Speech Clear. Not toxic looking HEENT: Atraumatic and Normocephalic, pupils equally reactive to light and accomodation Neck: Supple, no JVD. No cervical lymphadenopathy.  Chest: Good air entry bilaterally, no added sounds  CVS: S1 S2 regular, no murmurs.  Abdomen: Bowel  sounds present, Non tender and not distended with no gaurding, rigidity or rebound. Extremities: back, hip , knee pain  Neurology: Awake alert, and oriented X 3, Non focal Skin: No Rash  Data Review Lab Results  Component Value Date   HGBA1C 5.8 (H) 05/15/2022    Assessment & Plan  Aigner was seen today for anxiety and depression.  Diagnoses and all orders for this visit:  Anxiety and depression  Flowsheet Row Office Visit from 04/05/2023 in [facility]  PHQ-9 Total Score 17       Refer to [facility] and psychiatry   Morbid obesity (HCC) Discussed diet and exercise for person with BMI >25. Instructed: You must burn more calories than you eat. Losing 5 percent of your body weight should be considered a success. In the longer term, losing more than 15 percent of your body weight and staying at this weight is an extremely good result. However, keep in mind that even losing 5 percent of your body weight leads to important health benefits, so try not to get discouraged if you're not able to lose more than this. Least of problems     Patient have been counseled extensively about nutrition and exercise. Other issues discussed during this visit include: low cholesterol diet, weight control and daily exercise, foot care, annual eye examinations at Ophthalmology, importance of adherence with medications  and regular follow-up. We also discussed long term complications of uncontrolled diabetes and hypertension.   No follow-ups on file.  The patient was given clear instructions to go to ER or return to medical center if symptoms don't improve, worsen or new problems develop. The patient verbalized understanding. The patient was told to call to get lab results if they haven't heard anything in the next week.   This note has been created with Education officer, environmental. Any transcriptional errors are unintentional.   Grayce Sessions, NP 04/19/2023, 4:11 PM

## 2023-04-27 ENCOUNTER — Telehealth (INDEPENDENT_AMBULATORY_CARE_PROVIDER_SITE_OTHER): Payer: Self-pay | Admitting: Licensed Clinical Social Worker

## 2023-04-27 NOTE — Telephone Encounter (Signed)
LCSWA called patient today to introduce herself and to assess patients' mental health needs. Patient did not answer the phone. LCSWA was able to leave a brief message with the patient asking them to return the call. Patient was referred by PCP for anxiety, depression, homelessness.

## 2023-07-12 ENCOUNTER — Telehealth (INDEPENDENT_AMBULATORY_CARE_PROVIDER_SITE_OTHER): Payer: Self-pay | Admitting: Primary Care

## 2023-07-12 NOTE — Telephone Encounter (Signed)
Pt is calling in because she would like an update on the status of a referral that was put in for her for Physical Therapy. Pt says it has been 2 months and she hasn't heard from them.

## 2023-07-12 NOTE — Telephone Encounter (Signed)
Tamara Cline could you follow up with pt

## 2023-07-13 NOTE — Telephone Encounter (Signed)
Contacted pt and lvm making pt aware about referral and that I have reached out to provider in regards to placing another order and if she has any questions or concerns to give [place] a call

## 2023-07-13 NOTE — Telephone Encounter (Signed)
Will you place new referral. Will make pt aware

## 2023-07-19 ENCOUNTER — Ambulatory Visit (INDEPENDENT_AMBULATORY_CARE_PROVIDER_SITE_OTHER): Payer: Self-pay

## 2023-07-19 ENCOUNTER — Telehealth: Payer: Self-pay | Admitting: *Deleted

## 2023-07-19 ENCOUNTER — Ambulatory Visit (INDEPENDENT_AMBULATORY_CARE_PROVIDER_SITE_OTHER): Payer: Self-pay | Admitting: *Deleted

## 2023-07-19 NOTE — Telephone Encounter (Signed)
Patient requesting gabapentin 800 mg refill.  Medication not on current medication list. Please advise.

## 2023-07-19 NOTE — Telephone Encounter (Signed)
  Chief Complaint: back , hip, knee pain requesting medication for pain. Requesting refill neurontin  Symptoms: low back pain, hip pain knee pain. Frequency: on going  Pertinent Negatives: Patient denies not able to walk Disposition: [] ED /[] Urgent Care (no appt availability in office) / [x] Appointment(In office/virtual)/ []  [place] [facility]/ [] Home Care/ [] Refused Recommended Disposition /[]  Mobile Bus/ []  Follow-up with PCP Additional Notes:   Earliest appt scheduled 07/25/23 . Please advise regarding f/u with ortho and therapy has not contacted patient back for PT. Please advise. Patient requesting a call back. Reports she is currently homeless and rents hotel rooms at times.  Please advise.    Reason for Disposition  [1] MODERATE back pain (e.g., interferes with normal activities) AND [2] present > 3 days  Answer Assessment - Initial Assessment Questions 1. ONSET: "When did the pain begin?"      On going for a while  2. LOCATION: "Where does it hurt?" (upper, mid or lower back)     Low back, knees, hips 3. SEVERITY: "How bad is the pain?"  (e.g., Scale 1-10; mild, moderate, or severe)   - MILD (1-3): Doesn't interfere with normal activities.    - MODERATE (4-7): Interferes with normal activities or awakens from sleep.    - SEVERE (8-10): Excruciating pain, unable to do any normal activities.      Unable to sleep. Sleeps less than 6 hours a night. Pain with walking  4. PATTERN: "Is the pain constant?" (e.g., yes, no; constant, intermittent)      na 5. RADIATION: "Does the pain shoot into your legs or somewhere else?"     na 6. CAUSE:  "What do you think is causing the back pain?"      See previous c/o 7. BACK OVERUSE:  "Any recent lifting of heavy objects, strenuous work or exercise?"     Na  8. MEDICINES: "What have you taken so far for the pain?" (e.g., nothing, acetaminophen, NSAIDS)     Tylenol not working for pain  9. NEUROLOGIC SYMPTOMS: "Do you have any  weakness, numbness, or problems with bowel/bladder control?"     na 10. OTHER SYMPTOMS: "Do you have any other symptoms?" (e.g., fever, abdomen pain, burning with urination, blood in urine)       Low back , hips, knees 11. PREGNANCY: "Is there any chance you are pregnant?" "When was your last menstrual period?"       na  Protocols used: Back Pain-A-AH

## 2023-07-19 NOTE — Telephone Encounter (Signed)
  Chief Complaint: knee pain Symptoms: back, hip and knee pain, all over, affects daily activities, chronic pain Frequency: 1 year or more Pertinent Negatives: NA Disposition: [] ED /[] Urgent Care (no appt availability in office) / [] Appointment(In office/virtual)/ []  [place] Virtual Care/ [] Home Care/ [] Refused Recommended Disposition /[] [place] Mobile Bus/ [x]  Follow-up with PCP Additional Notes: pt states that she been taking Gabapentin 2 tabs daily and not helping with pain. Pt had PT referral but she says no one ever called her and been over 3 months. Pt is wanting something to help with pain or referral to Ortho. Attempted to schedule but lost pt, she was heading to disability office. Attempted to call pt back but no answer, LVMTCB. Will route to provider to FU on referral and other suggestions.   Summary: Hip Knee, Hip  Back Pain   Hip, Knee, Hip  and Back Pain, patient had Gabapentin in the past says she needs something stronger         Reason for Disposition  Knee pain is a chronic symptom (recurrent or ongoing AND present > 4 weeks)  Answer Assessment - Initial Assessment Questions 1. LOCATION and RADIATION: "Where is the pain located?"      Bilat knee 3. SEVERITY: "How bad is the pain?" "What does it keep you from doing?"   (Scale 1-10; or mild, moderate, severe)   -  MILD (1-3): doesn't interfere with normal activities    -  MODERATE (4-7): interferes with normal activities (e.g., work or school) or awakens from sleep, limping    -  SEVERE (8-10): excruciating pain, unable to do any normal activities, unable to walk     Moderate  4. ONSET: "When did the pain start?" "Does it come and go, or is it there all the time?"     Ongoing 1 year or more  5. RECURRENT: "Have you had this pain before?" If Yes, ask: "When, and what happened then?"     Gabapentin but not helping  7. AGGRAVATING FACTORS: "What makes the knee pain worse?" (e.g., walking, climbing stairs, running)      Walking or movement 9. OTHER SYMPTOMS: "Do you have any other symptoms?" (e.g., chest pain, difficulty breathing, fever, calf pain)    Back and hip pain  Protocols used: Knee Pain-A-AH

## 2023-07-25 ENCOUNTER — Ambulatory Visit (INDEPENDENT_AMBULATORY_CARE_PROVIDER_SITE_OTHER): Payer: 59 | Admitting: Primary Care

## 2023-07-30 ENCOUNTER — Telehealth (INDEPENDENT_AMBULATORY_CARE_PROVIDER_SITE_OTHER): Payer: Self-pay | Admitting: Primary Care

## 2023-07-30 NOTE — Telephone Encounter (Signed)
 Called pt to remind them about apt. Pt will be present

## 2023-07-31 ENCOUNTER — Ambulatory Visit (INDEPENDENT_AMBULATORY_CARE_PROVIDER_SITE_OTHER): Payer: 59 | Admitting: Primary Care

## 2023-08-17 ENCOUNTER — Ambulatory Visit (INDEPENDENT_AMBULATORY_CARE_PROVIDER_SITE_OTHER): Payer: 59 | Admitting: Primary Care

## 2023-09-03 ENCOUNTER — Ambulatory Visit (INDEPENDENT_AMBULATORY_CARE_PROVIDER_SITE_OTHER): Payer: Self-pay | Admitting: Primary Care

## 2023-09-03 NOTE — Telephone Encounter (Signed)
Copied from CRM [id number]. Topic: Clinical - Red Word Triage >> Sep 03, 2023 11:20 AM Antony Haste wrote: Red Word that prompted transfer to Nurse Triage: Left hand swelling and finger numbness.   Chief Complaint: Numbness in hands  Symptoms: Numbness and tingling in hands, left greater than right Frequency: Constant on the left, intermittent on the right  Pertinent Negatives: Patient denies chest pain, shortness of breath Disposition: [] ED /[] Urgent Care (no appt availability in office) / [x] Appointment(In office/virtual)/ []  [place] Virtual [facility]/ [] Home Care/ [] Refused Recommended Disposition /[] [place] Mobile Bus/ []  Follow-up with PCP Additional Notes: Patient reports bilateral numbness and tingling in her hands and fingers for the last two weeks. She states that her symptoms are intermittent in her right hand and constant in her left hand. She denies any other symptom at this time. Appointment made for Wednesday. Patient advised to call back for any new or worsening symptoms or to reschedule as needed     Reason for Disposition  [1] Numbness or tingling on both sides of body AND [2] is a new symptom present > 24 hours  Answer Assessment - Initial Assessment Questions 1. SYMPTOM: "What is the main symptom you are concerned about?" (e.g., weakness, numbness)     Numbness 2. ONSET: "When did this start?" (minutes, hours, days; while sleeping)     2 weeks ago 3. LAST NORMAL: "When was the last time you (the patient) were normal (no symptoms)?"     2 weeks ago 4. PATTERN "Does this come and go, or has it been constant since it started?"  "Is it present now?"     Constant   5. CARDIAC SYMPTOMS: "Have you had any of the following symptoms: chest pain, difficulty breathing, palpitations?"     No 6. NEUROLOGIC SYMPTOMS: "Have you had any of the following symptoms: headache, dizziness, vision loss, double vision, changes in speech, unsteady on your feet?"     Numbness of fingers of both  hands, left is constant, right is intermittent  7. OTHER SYMPTOMS: "Do you have any other symptoms?"     No 8. PREGNANCY: "Is there any chance you are pregnant?" "When was your last menstrual period?"     No  Protocols used: Neurologic Deficit-A-AH

## 2023-09-05 ENCOUNTER — Ambulatory Visit (INDEPENDENT_AMBULATORY_CARE_PROVIDER_SITE_OTHER): Payer: 59 | Admitting: Primary Care

## 2023-09-05 ENCOUNTER — Encounter (INDEPENDENT_AMBULATORY_CARE_PROVIDER_SITE_OTHER): Payer: Self-pay | Admitting: Primary Care

## 2023-09-05 VITALS — BP 125/85 | HR 87 | Resp 16 | Ht 65.0 in | Wt 353.2 lb

## 2023-09-05 DIAGNOSIS — M542 Cervicalgia: Secondary | ICD-10-CM | POA: Diagnosis not present

## 2023-09-05 DIAGNOSIS — Z5900 Homelessness unspecified: Secondary | ICD-10-CM | POA: Diagnosis not present

## 2023-09-05 DIAGNOSIS — Z2821 Immunization not carried out because of patient refusal: Secondary | ICD-10-CM

## 2023-09-05 DIAGNOSIS — G8929 Other chronic pain: Secondary | ICD-10-CM | POA: Diagnosis not present

## 2023-09-05 DIAGNOSIS — R0602 Shortness of breath: Secondary | ICD-10-CM

## 2023-09-05 DIAGNOSIS — Z1211 Encounter for screening for malignant neoplasm of colon: Secondary | ICD-10-CM | POA: Diagnosis not present

## 2023-09-05 DIAGNOSIS — M25552 Pain in left hip: Secondary | ICD-10-CM

## 2023-09-05 DIAGNOSIS — M25551 Pain in right hip: Secondary | ICD-10-CM

## 2023-09-05 DIAGNOSIS — M25561 Pain in right knee: Secondary | ICD-10-CM | POA: Diagnosis not present

## 2023-09-05 DIAGNOSIS — R45851 Suicidal ideations: Secondary | ICD-10-CM

## 2023-09-05 DIAGNOSIS — M25562 Pain in left knee: Secondary | ICD-10-CM

## 2023-09-05 NOTE — Patient Instructions (Addendum)
Community Resources  Advocacy/Legal Legal Aid [place]:  [phone]  /  [phone]  Family Justice Center:  ([phone]  Family Service of the [facility] 24-hr Crisis line:  418-014-5394  [facility] [place]:  [phone]  Court Watch (custody):  [phone]  [employer] Law Clinic:   [phone]    Baby & Breastfeeding Car Seat Inspection @ Various [place] Equities trader.- call [phone]  [facility] Health Lactation  [phone]  [facility] Lactation [phone]  WIC: [phone] ([place]);  [phone] ([place])  [place] League:  [phone]   Childcare [place] Child Development: [phone] [facility]) / [phone] ([place])  - Child Care Resources/ Referrals/ Scholarships  - Head Start/ Early Head Start (call or apply online)  [employer] DHHS: [place] Pre-K :  [phone] / [phone]   Employment / Job Search [employer] of [place]: [phone] / [address]  [place] [employer] (JobLink): [phone] ([place]) / [phone] ([place])  [employer] Engineer, materials Center: [phone] / [phone]  [facility] Job & Career Center: [phone]  DHHS Work First: [phone] ([place]) / [phone] ([place])  StepUp Ministry [place]:  (804) [phone]   Financial Assistance [place] Ministry:  626 420 0620  [employer]: 773-853-9454  Dominica Severin Network (furniture):  7240205478  [facility] Helping Hands: (831)300-2926  Low Income Energy Assistance  (360)819-5280   Food Assistance DHHS- SNAP/ Food Stamps: 316-272-6472  WIC: Manley Mason203-114-5016 ;  HP 787-244-4816  [place]ne [place] Book- Free Meals  Little Blue Book- Free Food Pantries  During the summer, text "FOOD" to 916384   General Health / Clinics (Adults) Orange Card (for Adults) through [facility]: [phone]  [facility]:   2155654914  [facility] Health & Wellness:   [phone]  Health Department:  825-198-4396  [facility] Community Health:  3608839490 / ([phone]  Planned Parenthood of [place]:   (854)397-1722  [facility]:   [phone] [phone]   Housing [place] Housing Coalition:   (216)360-4903  [facility] Housing Authority:  445-702-0411  Affordable Housing Managemnt:  (602) 515-6945   Immigrant/ Refugee Center for [facility]):  (434) 515-3282  Faith [employer] House:  709-828-8692  [employer]:  249-490-7446  Parks Ranger Services:  (458)336-1058  African Services Coalition:  (669) 024-1365   LGBTQ YouthSAFE  www.youthsafe[place].org  PFLAG  587-256-2628 / info@[place] .[place] Project:  231 214 5977   Mental Health/ Substance Use Family Service of the [facility]  959-609-4647  [facility] Behavioral Health:  239-066-4363 or 1-228 273 7429  [facility] of Care:  [phone]  Journeys Counseling:  [phone]  [facility] Care Services:  [phone]  Vesta Mixer (walk-ins)  934-146-0578 / [address]  [place]:  732-636-8775  Alcoholics Anonymous:  (705) 385-0204  Narcotics Anonymous:  405 406 4030  Quit Smoking Hotline:  800-QUIT-NOW (608)181-3256)   Parenting Children's Home Society:  726-102-5601  [facility] Health: Education Center & Support Groups:  5043852067  YWCA: [phone]  [employer]: Bringing Out the Best:  334-518-4075               Thriving at Three (Hispanic families): 223-547-6658  Healthy Start (Family Service of the [place]):  364-878-4794 [phone]  Parents as Teachers:  [phone]  [place] Child Development- Learning Together (Immigrants): 567-760-1968   Poison Control 318-315-5542  Sports & Recreation [employer] Open Doors Application: https://www.rich.com/  [place] of [place] Recreation Centers: http://www.[place].gov/index.aspx?page=3615   Special Needs Family Support Network:  208-867-2207  Autism Society of [place]:   7797134899 or [phone] /  631-263-7856  [facility]:  206-812-7591  ARC of [place]:  (917) 430-4176  Children's Developmental Service Agency (CDSA):  219 649 9878  [place] [facility] (Care Coordination for Children):  423-706-3654   Transportation Medicaid Transportation: 818-522-6212 to apply  [place] Authority: (305)732-6381 (reduced-fare bus ID to Medicaid/ Medicare/ Orange Card)  SCAT Paratransit services: Eligible riders only, call [phone] for application   Tutoring/Mentoring Black Child Development Institute: (804) 630-6889  [facility] Brothers/ Big Sisters: 2401527000 810-697-9603 ([place])  ACES through child's school: (914) [phone]  [employer] Achievers: contact your local Y  SHIELD Mentor Program: 336-455-4070      [facility] Health Outpatient Orthopedic Rehabilitation  [address] [place], [place]  [address] PH# [phone]

## 2023-09-05 NOTE — Progress Notes (Signed)
[facility]  Tamara Cline, is a 48 y.o. female  UJW:[id number]  FAO:[id number]  DOB - Sep 21, 1975  Chief Complaint  Patient presents with   Numbness    Fingers on left hand    Knee Pain   Hip Pain   Form Completion    Pt has a form from DSS that needs to be filled out provider        Subjective:   Tamara Cline is a 48 y.o. Morbid obese here today for an acute visit.  She is homeless.  Also unstable gait and uses a rolling walker.  Patient gave permission for her husband to be present at this appointment.  Shortness of breath with exertion.  She also has coughing which makes her chest hurt and shortness of breath worse.  She also snores- Patient presents with possible obstructive sleep apnea. Patent has a 10 years history of symptoms of daytime fatigue and morning fatigue. Patient generally gets 1 or 2 hours of sleep per night, and states they generally have difficulty falling asleep, nightime awakenings, and difficulty falling back asleep if awakened. Snoring of severe severity is present. Apneic episodes unknown present. Nasal obstruction is not present.  Patient has had tonsillectomy.  Patient stated she has had respiratory problems since a child asthma and bronchitis.  She has complaints of numbness hands started 2 weeks ago.  This is chronic  cervical , hips and knees pain. Pain score 10/10 >. Previously seeing orthopedics for right hip pain 06/07/2022   No problems updated.  Comprehensive ROS Pertinent positive and negative noted in HPI   No Known Allergies  Past Medical History:  Diagnosis Date   Asthma     No current outpatient medications on file prior to visit.   No current facility-administered medications on file prior to visit.   Health Maintenance  Topic Date Due   Pap with HPV screening  Never done   Colon Cancer Screening  Never done   COVID-19 Vaccine (1 - 2024-25 season) Never done   Flu Shot  11/05/2023*   DTaP/Tdap/Td vaccine (1 -  Tdap) 09/04/2024*   Pneumococcal Vaccination (1 of 2 - PCV) 09/04/2024*   Hepatitis C Screening  Completed   HIV Screening  Completed   HPV Vaccine  Aged Out  *Topic was postponed. The date shown is not the original due date.    Objective:  BP 125/85 (BP Location: Right Arm, Patient Position: Sitting, Cuff Size: Large)   Pulse 87   Resp 16   Ht 5\' 5"  (1.651 m)   Wt (!) 353 lb 3.2 oz (160.2 kg)   SpO2 95%   BMI 58.78 kg/m   Vitals:   09/05/23 1353 09/05/23 1355  BP: (!) 150/82 125/85  Pulse: 87   Resp: 16   SpO2: 95%   Weight: (!) 353 lb 3.2 oz (160.2 kg)   Height: 5\' 5"  (1.651 m)    BP Readings from Last 3 Encounters:  09/05/23 125/85  04/19/23 119/83  04/05/23 120/75      Physical Exam Vitals reviewed.  Constitutional:      Appearance: She is obese.     Comments: morbid  HENT:     Head: Normocephalic.     Nose: Nose normal.  Eyes:     Extraocular Movements: Extraocular movements intact.  Cardiovascular:     Rate and Rhythm: Normal rate and regular rhythm.  Pulmonary:     Effort: Respiratory distress present.  Abdominal:     General: Bowel sounds  are normal. There is distension.     Palpations: Abdomen is soft.  Musculoskeletal:        General: Swelling present.     Cervical back: Normal range of motion and neck supple.     Right lower leg: Edema present.     Left lower leg: Edema present.  Skin:    General: Skin is warm and dry.  Neurological:     Mental Status: She is oriented to person, place, and time.  Psychiatric:     Comments: She admits to being harmful to self and others.  She has no plan out thoughts but did say overdose.  Having visual and auditory hallucinations     Assessment & Plan  Tayelor was seen today for numbness, knee pain, hip pain and form completion.  Diagnoses and all orders for this visit:  Tetanus, diphtheria, and acellular pertussis (Tdap) vaccination declined  Pneumococcal vaccination declined  Screening for colon  cancer -     Ambulatory referral to Gastroenterology  Shortness of breath -     CBC with Differential/Platelet  Homelessness Resources provided on AVS  Suicidal ideations patient given information for walk in clinic on [facility] [facility] -     Ambulatory referral to Psychiatry  Chronic pain of both hips -     For home use only DME Other see comment -     Ambulatory referral to Orthopedic Surgery  Chronic pain of both knees -     For home use only DME Other see comment -     Ambulatory referral to Orthopedic Surgery  Morbid obesity (HCC) Morbid Obesity is > 40  indicating an excess in caloric intake or underlining conditions. This may lead to other co-morbidities. Educated on lifestyle modifications of diet and exercise which may reduce obesity.   -     CMP14+EGFR -     Lipid panel -     Hemoglobin A1c  Cervical pain (neck)  Probable cause of new onset of numbness in hands    Ambulatory referral to Orthopedic Surgery  Patient have been counseled extensively about nutrition and exercise. Other issues discussed during this visit include: low cholesterol diet, weight control and daily exercise, foot care, annual eye examinations at Ophthalmology, importance of adherence with medications and regular follow-up. We also discussed long term complications of uncontrolled diabetes and hypertension.  6 weeks pap   The patient was given clear instructions to go to ER or return to medical center if symptoms don't improve, worsen or new problems develop. The patient verbalized understanding. The patient was told to call to get lab results if they haven't heard anything in the next week.   This note has been created with Education officer, environmental. Any transcriptional errors are unintentional.   Grayce Sessions, NP 09/05/2023, 2:39 PM

## 2023-09-06 LAB — HEMOGLOBIN A1C
Est. average glucose Bld gHb Est-mCnc: 131 mg/dL
Hgb A1c MFr Bld: 6.2 % — ABNORMAL HIGH (ref 4.8–5.6)

## 2023-09-06 LAB — LIPID PANEL
Chol/HDL Ratio: 4.9 {ratio} — ABNORMAL HIGH (ref 0.0–4.4)
Cholesterol, Total: 206 mg/dL — ABNORMAL HIGH (ref 100–199)
HDL: 42 mg/dL (ref >39)
LDL Chol Calc (NIH): 137 mg/dL — ABNORMAL HIGH (ref 0–99)
Triglycerides: 150 mg/dL — ABNORMAL HIGH (ref 0–149)
VLDL Cholesterol Cal: 27 mg/dL (ref 5–40)

## 2023-09-06 LAB — CBC WITH DIFFERENTIAL/PLATELET
Basophils Absolute: 0 10*3/uL (ref 0.0–0.2)
Basos: 1 %
EOS (ABSOLUTE): 0.1 10*3/uL (ref 0.0–0.4)
Eos: 2 %
Hematocrit: 40.4 % (ref 34.0–46.6)
Hemoglobin: 12.8 g/dL (ref 11.1–15.9)
Immature Grans (Abs): 0 10*3/uL (ref 0.0–0.1)
Immature Granulocytes: 0 %
Lymphocytes Absolute: 2.4 10*3/uL (ref 0.7–3.1)
Lymphs: 35 %
MCH: 29.4 pg (ref 26.6–33.0)
MCHC: 31.7 g/dL (ref 31.5–35.7)
MCV: 93 fL (ref 79–97)
Monocytes Absolute: 0.6 10*3/uL (ref 0.1–0.9)
Monocytes: 8 %
Neutrophils Absolute: 3.7 10*3/uL (ref 1.4–7.0)
Neutrophils: 54 %
Platelets: 309 10*3/uL (ref 150–450)
RBC: 4.36 x10E6/uL (ref 3.77–5.28)
RDW: 12.6 % (ref 11.7–15.4)
WBC: 6.8 10*3/uL (ref 3.4–10.8)

## 2023-09-06 LAB — CMP14+EGFR
ALT: 14 [IU]/L (ref 0–32)
AST: 14 [IU]/L (ref 0–40)
Albumin: 3.8 g/dL — ABNORMAL LOW (ref 3.9–4.9)
Alkaline Phosphatase: 97 [IU]/L (ref 44–121)
BUN/Creatinine Ratio: 13 (ref 9–23)
BUN: 9 mg/dL (ref 6–24)
Bilirubin Total: <0.2 mg/dL (ref 0.0–1.2)
CO2: 26 mmol/L (ref 20–29)
Calcium: 9.2 mg/dL (ref 8.7–10.2)
Chloride: 102 mmol/L (ref 96–106)
Creatinine, Ser: 0.69 mg/dL (ref 0.57–1.00)
Globulin, Total: 2.9 g/dL (ref 1.5–4.5)
Glucose: 106 mg/dL — ABNORMAL HIGH (ref 70–99)
Potassium: 4.4 mmol/L (ref 3.5–5.2)
Sodium: 141 mmol/L (ref 134–144)
Total Protein: 6.7 g/dL (ref 6.0–8.5)
eGFR: 106 mL/min/{1.73_m2} (ref >59)

## 2023-09-10 ENCOUNTER — Other Ambulatory Visit (INDEPENDENT_AMBULATORY_CARE_PROVIDER_SITE_OTHER): Payer: Self-pay | Admitting: Primary Care

## 2023-09-10 MED ORDER — ATORVASTATIN CALCIUM 40 MG PO TABS: 40.0000 mg | ORAL_TABLET | Freq: Every day | ORAL | 3 refills | Status: DC

## 2023-09-12 ENCOUNTER — Telehealth (INDEPENDENT_AMBULATORY_CARE_PROVIDER_SITE_OTHER): Payer: Self-pay

## 2023-09-12 ENCOUNTER — Telehealth (INDEPENDENT_AMBULATORY_CARE_PROVIDER_SITE_OTHER): Payer: Self-pay | Admitting: Primary Care

## 2023-09-12 NOTE — Telephone Encounter (Signed)
 Copied from CRM [id number]. Topic: General - Other >> Sep 12, 2023 11:09 AM Deleta HERO wrote: Reason for CRM: PT states DME provider is needing an address to send her new rolling walker to. The patient states she is currently homeless and would like it sent to the office, if possible.

## 2023-09-12 NOTE — Telephone Encounter (Signed)
 Called pt to inform them on where to find our office. Address was left on VM

## 2023-09-12 NOTE — Telephone Encounter (Signed)
 Please reach out to pt to provide our address

## 2023-09-13 ENCOUNTER — Telehealth (INDEPENDENT_AMBULATORY_CARE_PROVIDER_SITE_OTHER): Payer: Self-pay | Admitting: Licensed Clinical Social Worker

## 2023-09-13 NOTE — Telephone Encounter (Signed)
 LCSWA called patient today to introduce herself and to assess patients' mental health needs. Patient did not answer the phone. LCSWA was able to leave a brief message with the patient asking them to return the call. Patient was referred by PCP for Depression , Possible suicidal ideation. Will follow up again soon.

## 2023-09-14 ENCOUNTER — Encounter (INDEPENDENT_AMBULATORY_CARE_PROVIDER_SITE_OTHER): Payer: Self-pay

## 2023-10-04 ENCOUNTER — Telehealth (INDEPENDENT_AMBULATORY_CARE_PROVIDER_SITE_OTHER): Payer: Self-pay

## 2023-10-05 NOTE — Telephone Encounter (Signed)
 Called patient twice unable to make contact or leave voicemail. Patient had question regarding if her rolling walker had been sent to our address

## 2023-10-11 ENCOUNTER — Telehealth (INDEPENDENT_AMBULATORY_CARE_PROVIDER_SITE_OTHER): Payer: Self-pay | Admitting: Primary Care

## 2023-10-11 NOTE — Telephone Encounter (Signed)
 Pt's number is unavailable.

## 2023-10-17 ENCOUNTER — Telehealth (INDEPENDENT_AMBULATORY_CARE_PROVIDER_SITE_OTHER): Payer: Self-pay | Admitting: Primary Care

## 2023-10-17 ENCOUNTER — Ambulatory Visit (INDEPENDENT_AMBULATORY_CARE_PROVIDER_SITE_OTHER): Payer: 59 | Admitting: Primary Care

## 2023-10-17 NOTE — Telephone Encounter (Signed)
 Called pt to see about rescheduling appt. Pt did not answer and could not leave VM because mailbox was full.

## 2023-10-29 ENCOUNTER — Ambulatory Visit: Payer: Self-pay | Admitting: Primary Care

## 2023-10-29 NOTE — Telephone Encounter (Signed)
 Copied from CRM [id number]. Topic: Appointments - Appointment Scheduling >> Oct 29, 2023 10:54 AM Emylou G wrote: back, knees, and hip pain - pls call her back.. [phone] >> Oct 29, 2023 12:26 PM Clayton Bibles wrote: Tamara Cline is calling back because no one has returned her call about bad pain in her back, knees, and hip   Chief Complaint: Back pain Symptoms: Knee pain, hip pain Frequency: intermittent Pertinent Negatives: Patient denies radiating pain Disposition: [] ED /[] Urgent Care (no appt availability in office) / [x] Appointment(In office/virtual)/ []  [facility]/ [] Home Care/ [] Refused Recommended Disposition /[] [facility]/ []  Follow-up with PCP Additional Notes: Pt reports ongoing back pain with knee pain and hip/groin pain states she has been in pain for years and was worsened after a fall 1 week ago. Pt unable to recall how or what made her fall or what injuries she sustained. Pt also expressed her frustration with not having her DME yet, per patient and chart review and rollator was supposed to be ordered. Pt requesting update. Pt unable to schedule visit for today d/t lack of transportation. Appt scheduled for 3/31 Reason for Disposition  Back pain present > 2 weeks  Answer Assessment - Initial Assessment Questions 1. ONSET: "When did the pain begin?"      Started several years ago  2. LOCATION: "Where does it hurt?" (upper, mid or lower back)     Lower back, right knee, right hip and groin  3. SEVERITY: "How bad is the pain?"  (e.g., Scale 1-10; mild, moderate, or severe)   - MILD (1-3): Doesn't interfere with normal activities.    - MODERATE (4-7): Interferes with normal activities or awakens from sleep.    - SEVERE (8-10): Excruciating pain, unable to do any normal activities.      Severe pain  4. PATTERN: "Is the pain constant?" (e.g., yes, no; constant, intermittent)      Constant  5. RADIATION: "Does the pain shoot into your legs or somewhere  else?"     No, does not radiate  6. CAUSE:  "What do you think is causing the back pain?"      Fell 1 week ago, tripped over a shoe. Doesn't recall how she fell. States that she hit her head  7. BACK OVERUSE:  "Any recent lifting of heavy objects, strenuous work or exercise?"     No  8. MEDICINES: "What have you taken so far for the pain?" (e.g., nothing, acetaminophen, NSAIDS)     Took a percocet from a family member  9. NEUROLOGIC SYMPTOMS: "Do you have any weakness, numbness, or problems with bowel/bladder control?"     Yes, but sts that is normal for several years  10. OTHER SYMPTOMS: "Do you have any other symptoms?" (e.g., fever, abdomen pain, burning with urination, blood in urine)       Groin pain   11. PREGNANCY: "Is there any chance you are pregnant?" "When was your last menstrual period?"       No  Protocols used: Back Pain-A-AH

## 2023-11-05 ENCOUNTER — Ambulatory Visit (INDEPENDENT_AMBULATORY_CARE_PROVIDER_SITE_OTHER): Admitting: Primary Care

## 2023-11-05 ENCOUNTER — Encounter (INDEPENDENT_AMBULATORY_CARE_PROVIDER_SITE_OTHER): Payer: Self-pay | Admitting: Primary Care

## 2023-11-05 VITALS — BP 136/88 | HR 50 | Ht 65.0 in | Wt 345.4 lb

## 2023-11-05 DIAGNOSIS — M542 Cervicalgia: Secondary | ICD-10-CM

## 2023-11-05 DIAGNOSIS — Z139 Encounter for screening, unspecified: Secondary | ICD-10-CM | POA: Diagnosis not present

## 2023-11-05 DIAGNOSIS — M545 Low back pain, unspecified: Secondary | ICD-10-CM | POA: Diagnosis not present

## 2023-11-05 NOTE — Patient Instructions (Signed)
 Preventing Hypertension Hypertension, also called high blood pressure, is when the force of blood pumping through the arteries is too strong. Arteries are blood vessels that carry blood from the heart throughout the body. Often, hypertension does not cause symptoms until blood pressure is very high. It is important to have your blood pressure checked regularly. Diet and lifestyle changes can help you prevent hypertension, and they may make you feel better overall and improve your quality of life. If you already have hypertension, you may control it with diet and lifestyle changes, as well as with medicine. How can this condition affect me? Over time, hypertension can damage the arteries and decrease blood flow to important parts of the body, including the brain, heart, and kidneys. By keeping your blood pressure in a healthy range, you can help prevent complications like heart attack, heart failure, stroke, kidney failure, and vascular dementia. What can increase my risk? An unhealthy diet and a lack of physical activity can make you more likely to develop high blood pressure. Some other risk factors include: Age. The risk increases with age. Having family members who have had high blood pressure. Having certain health conditions, such as thyroid problems. Being overweight or obese. Drinking too much alcohol or caffeine. Having too much fat, sugar, calories, or salt (sodium) in your diet. Smoking or using illegal drugs. Taking certain medicines, such as antidepressants, decongestants, birth control pills, and NSAIDs, such as ibuprofen. What actions can I take to prevent or manage this condition? Work with your health care provider to make a hypertension prevention plan that works for you. You may be referred for counseling on a healthy diet and physical activity. Follow your plan and keep all follow-up visits. Diet changes Maintain a healthy diet. This includes: Eating less salt (sodium). Ask your  health care provider how much sodium is safe for you to have. The general recommendation is to have less than 1 tsp (2,300 mg) of sodium a day. Do not add salt to your food. Choose low-sodium options when grocery shopping and eating out. Limiting fats in your diet. You can do this by eating low-fat or fat-free dairy products and by eating less red meat. Eating more fruits, vegetables, and whole grains. Make a goal to eat: 1-2 cups of fresh fruits and vegetables each day. 3-4 servings of whole grains each day. Avoiding foods and beverages that have added sugars. Eating fish that contain healthy fats (omega-3 fatty acids), such as mackerel or salmon. If you need help putting together a healthy eating plan, try the DASH diet. This diet is high in fruits, vegetables, and whole grains. It is low in sodium, red meat, and added sugars. DASH stands for Dietary Approaches to Stop Hypertension. Lifestyle changes  Lose weight if you are overweight. Losing just 3-5% of your body weight can help prevent or control hypertension. For example, if your present weight is 200 lb (91 kg), a loss of 3-5% of your weight means losing 6-10 lb (2.7-4.5 kg). Ask your health care provider to help you with a diet and exercise plan to safely lose weight. Get enough exercise. Do at least 150 minutes of moderate-intensity exercise each week. You could do this in short exercise sessions several times a day, or you could do longer exercise sessions a few times a week. For example, you could take a brisk 10-minute walk or bike ride, 3 times a day, for 5 days a week. Find ways to reduce stress, such as exercising, meditating, listening to  music, or taking a yoga class. If you need help reducing stress, ask your health care provider. Do not use any products that contain nicotine or tobacco. These products include cigarettes, chewing tobacco, and vaping devices, such as e-cigarettes. Chemicals in tobacco and nicotine products raise your  blood pressure each time you use them. If you need help quitting, ask your health care provider. Learn how to check your blood pressure at home. Make sure that you know your personal target blood pressure, as told by your health care provider. Try to sleep 7-9 hours per night. Alcohol use Do not drink alcohol if: Your health care provider tells you not to drink. You are pregnant, may be pregnant, or are planning to become pregnant. If you drink alcohol: Limit how much you have to: 0-1 drink a day for women. 0-2 drinks a day for men. Know how much alcohol is in your drink. In the [place] one drink equals one 12 oz bottle of beer (355 mL), one 5 oz glass of wine (148 mL), or one 1 oz glass of hard liquor (44 mL). Medicines In addition to diet and lifestyle changes, your health care provider may recommend medicines to help lower your blood pressure. In general: You may need to try a few different medicines to find what works best for you. You may need to take more than one medicine. Take over-the-counter and prescription medicines only as told by your health care provider. Questions to ask your health care provider What is my blood pressure goal? How can I lower my risk for high blood pressure? How should I monitor my blood pressure at home? Where to find support Your health care provider can help you prevent hypertension and help you keep your blood pressure at a healthy level. Your local hospital or your community may also provide support services and prevention programs. The American Heart Association offers an online support network at supportnetwork.heart.org Where to find more information Learn more about hypertension from: National Heart, Lung, and Blood Institute: PopSteam.is Centers for Disease Control and Prevention: FootballExhibition.com.br American Academy of Family Physicians: familydoctor.org Learn more about the DASH diet from: National Heart, Lung, and Blood Institute:  PopSteam.is Contact a health care provider if: You think you are having a reaction to medicines you have taken. You have recurrent headaches or feel dizzy. You have swelling in your ankles. You have trouble with your vision. Get help right away if: You have sudden, severe chest, back, or abdominal pain or discomfort. You have shortness of breath. You have a sudden, severe headache. These symptoms may be an emergency. Get help right away. Call 911. Do not wait to see if the symptoms will go away. Do not drive yourself to the hospital. Summary Hypertension often does not cause any symptoms until blood pressure is very high. It is important to get your blood pressure checked regularly. Diet and lifestyle changes are important steps in preventing hypertension. By keeping your blood pressure in a healthy range, you may prevent complications like heart attack, heart failure, stroke, and kidney failure. Work with your health care provider to make a hypertension prevention plan that works for you. This information is not intended to replace advice given to you by your health care provider. Make sure you discuss any questions you have with your health care provider. Document Revised: 05/12/2021 Document Reviewed: 05/12/2021 Elsevier Patient Education  2024 [employer].

## 2023-11-05 NOTE — Progress Notes (Unsigned)
 [facility]  Tamara Cline, is a 48 y.o. female  ZOX:[id number]  WJX:[id number]  DOB - [birth year]  Chief Complaint  Patient presents with   Back Pain   Knee Pain   Fall       Subjective:   Tamara Cline is a 48 y.o. female here today for an acute visit.  Patient fell approximately 2 to 3 weeks ago over a shoe visiting her grandchildren.  Since then she has had increased pain in her back and knee radiating pain 10 out of 10.  Cervical pain is 1 through 7 and lower lumbar pain is 1 through 5 pain pain is nonradiating.  She also mention throbbing pain from her hips to her toes.  She did not go to the emergency room after the fall thinking the pain would get better and it has not.  No x-rays have been done for further assessment.  She also has a unstable gait and is in need of a walker for stability however she is homeless without a address and it has been difficult to provide this equipment to her.  During this visit she is short of breath without exertion and increases with exertion.  HPI  No problems updated.  Comprehensive ROS Pertinent positive and negative noted in HPI   No Known Allergies  Past Medical History:  Diagnosis Date   Asthma     Current Outpatient Medications on File Prior to Visit  Medication Sig Dispense Refill   atorvastatin (LIPITOR) 40 MG tablet Take 1 tablet (40 mg total) by mouth daily. (Patient not taking: Reported on 11/05/2023) 90 tablet 3   No current facility-administered medications on file prior to visit.   Health Maintenance  Topic Date Due   Pap with HPV screening  Never done   Colon Cancer Screening  Never done   COVID-19 Vaccine (1 - 2024-25 season) Never done   Flu Shot  11/05/2023*   DTaP/Tdap/Td vaccine (1 - Tdap) 09/04/2024*   Pneumococcal Vaccination (1 of 2 - PCV) 09/04/2024*   Hepatitis C Screening  Completed   HIV Screening  Completed   HPV Vaccine  Aged Out  *Topic was postponed. The date shown is not the  original due date.    Objective:   Vitals:   11/05/23 0843  Pulse: (!) 50  SpO2: 95%  Weight: (!) 345 lb 6.4 oz (156.7 kg)   BP Readings from Last 3 Encounters:  09/05/23 125/85  04/19/23 119/83  04/05/23 120/75      Physical Exam Vitals reviewed.  Constitutional:      Appearance: Normal appearance. She is obese.     Comments: morbid  HENT:     Head: Normocephalic.     Right Ear: Tympanic membrane, ear canal and external ear normal.     Left Ear: Tympanic membrane, ear canal and external ear normal.     Nose: Nose normal.     Mouth/Throat:     Mouth: Mucous membranes are moist.  Eyes:     Extraocular Movements: Extraocular movements intact.     Pupils: Pupils are equal, round, and reactive to light.  Cardiovascular:     Rate and Rhythm: Normal rate.  Pulmonary:     Effort: Pulmonary effort is normal.     Breath sounds: Normal breath sounds.  Abdominal:     General: Bowel sounds are normal.     Palpations: Abdomen is soft.  Musculoskeletal:        General: Normal range of motion.  Cervical back: Normal range of motion.  Skin:    General: Skin is warm and dry.  Neurological:     Mental Status: She is alert and oriented to person, place, and time.  Psychiatric:        Mood and Affect: Mood normal.        Behavior: Behavior normal.        Thought Content: Thought content normal.     {Labs (Optional):23779}  Assessment & Plan   There are no diagnoses linked to this encounter.   Patient have been counseled extensively about nutrition and exercise. Other issues discussed during this visit include: low cholesterol diet, weight control and daily exercise, foot care, annual eye examinations at Ophthalmology, importance of adherence with medications and regular follow-up. We also discussed long term complications of uncontrolled diabetes and hypertension.    The patient was given clear instructions to go to ER or return to medical center if symptoms don't  improve, worsen or new problems develop. The patient verbalized understanding. The patient was told to call to get lab results if they haven't heard anything in the next week.   This note has been created with Education officer, environmental. Any transcriptional errors are unintentional.   Grayce Sessions, NP 11/05/2023, 8:46 AM

## 2023-11-06 ENCOUNTER — Telehealth: Payer: Self-pay | Admitting: *Deleted

## 2023-11-06 NOTE — Progress Notes (Unsigned)
 Complex Care Management Note Care Guide Note  11/06/2023 Name: Lilac Hoff MRN: [id number] DOB: July 07, 1976   Complex Care Management Outreach Attempts: An unsuccessful telephone outreach was attempted today to offer the patient information about available complex care management services.  Follow Up Plan:  Additional outreach attempts will be made to offer the patient complex care management information and services.   Encounter Outcome:  No Answer  Gwenevere Ghazi  [facility]  [facility], [facility] Guide  Direct Dial: [phone]  Fax [phone]

## 2023-11-07 ENCOUNTER — Telehealth (INDEPENDENT_AMBULATORY_CARE_PROVIDER_SITE_OTHER): Payer: Self-pay

## 2023-11-07 ENCOUNTER — Telehealth (INDEPENDENT_AMBULATORY_CARE_PROVIDER_SITE_OTHER): Payer: Self-pay | Admitting: Primary Care

## 2023-11-07 NOTE — Progress Notes (Unsigned)
 Complex Care Management Note Care Guide Note  11/07/2023 Name: Tamara Cline MRN: [id number] DOB: 22-Mar-1976   Complex Care Management Outreach Attempts: A second unsuccessful outreach was attempted today to offer the patient with information about available complex care management services.  Follow Up Plan:  Additional outreach attempts will be made to offer the patient complex care management information and services.   Encounter Outcome:  No Answer  Gwenevere Ghazi  [facility]  [facility], [facility] Guide  Direct Dial: [phone]  Fax [phone]

## 2023-11-07 NOTE — Telephone Encounter (Signed)
error 

## 2023-11-07 NOTE — Telephone Encounter (Signed)
 E2C2 called with pt on the line. Stating that they needed a rolling walker to be ordered by provider and delivered to the office ([address]) [place] [address]. I told pt it was okay for walker to be sent here and I would pass the message to the provider one she is back from lunch.  Please advise

## 2023-11-07 NOTE — Telephone Encounter (Signed)
 Called patient husband picked up and stated that she was not around. I was unable to pull up release of info due to internet connection.   I talked with Erskine Squibb and she stated that we can [facility] and have patient pick up. We can also provide ride. Husband stated that they will call tomorrow morning for information

## 2023-11-08 NOTE — Progress Notes (Addendum)
 Complex Care Management Note  Care Guide Note 11/08/2023 Name: Tamara Cline MRN: [id number] DOB: January 09, 1976  Tamara Cline is a 48 y.o. year old female who sees Grayce Sessions, NP for primary care. I reached out to [employer] by phone today to offer complex care management services.  [name] was given information about Complex Care Management services today including:   The Complex Care Management services include support from the care team which includes your Nurse Care Manager, Clinical Social Worker, or Pharmacist.  The Complex Care Management team is here to help remove barriers to the health concerns and goals most important to you. Complex Care Management services are voluntary, and the patient may decline or stop services at any time by request to their care team member.   Complex Care Management Consent Status: Patient agreed to services and verbal consent obtained.   Follow up plan:  Telephone appointment with complex care management team member scheduled for:  4/7  Routed referral to community resources care guide for housing resources.    Encounter Outcome:  Patient Scheduled SDOH Interventions Today    Flowsheet Row Most Recent Value  SDOH Interventions   Food Insecurity Interventions Patient Declined  [Patient has food stamps and does not need resourses]        Gwenevere Ghazi  [facility], [facility] Guide  Direct Dial: [phone]  Fax [phone]

## 2023-11-09 ENCOUNTER — Telehealth (INDEPENDENT_AMBULATORY_CARE_PROVIDER_SITE_OTHER): Payer: Self-pay | Admitting: Primary Care

## 2023-11-09 ENCOUNTER — Telehealth: Payer: Self-pay

## 2023-11-09 NOTE — Telephone Encounter (Signed)
 E2C2 called with pt on the line. Pt stated that she wanted to speak with provider. Pt had questions about referral information and questions pertaining to her walker. I told pt that I would message provider to see about contact options and referral information was given to pt.

## 2023-11-09 NOTE — Progress Notes (Signed)
 Complex Care Management Note Care Guide Note  11/09/2023 Name: Tamara Cline MRN: [id number] DOB: [birth year]   Complex Care Management Outreach Attempts: An unsuccessful telephone outreach was attempted today to offer the patient information about available complex care management services.  Follow Up Plan:  Additional outreach attempts will be made to offer the patient complex care management information and services.   Encounter Outcome:  No Answer  Niharika Savino [facility]  [facility] Guide Direct Dial: [phone]  Fax: [phone] Website: [email or web address]

## 2023-11-12 ENCOUNTER — Telehealth: Payer: Self-pay

## 2023-11-12 ENCOUNTER — Other Ambulatory Visit: Payer: Self-pay

## 2023-11-12 NOTE — Telephone Encounter (Signed)
 Copied from CRM [id number]. Topic: General - Other >> Nov 08, 2023  8:48 AM Almira Coaster wrote: Reason for CRM: Patient is returning a call she received yesterday, she advised that she currently has no phone and a good call back number for her would be [phone].

## 2023-11-12 NOTE — Telephone Encounter (Signed)
 No problem.

## 2023-11-12 NOTE — Progress Notes (Signed)
 Complex Care Management Note Care Guide Note  11/12/2023 Name: Tamara Cline MRN: [id number] DOB: 10/25/1975   Complex Care Management Outreach Attempts: A second unsuccessful outreach was attempted today to offer the patient with information about available complex care management services.  Follow Up Plan:  Additional outreach attempts will be made to offer the patient complex care management information and services.   Encounter Outcome:  No Answer  Tabby Beaston [facility]  [facility] Guide Direct Dial: [phone]  Fax: [phone] Website: [email or web address]

## 2023-11-12 NOTE — Telephone Encounter (Signed)
 Looks like care management was trying to reach pt

## 2023-11-13 ENCOUNTER — Telehealth: Payer: Self-pay

## 2023-11-13 NOTE — Telephone Encounter (Signed)
 Copied from CRM [id number]. Topic: General - Other >> Nov 12, 2023  4:26 PM Fredrich Romans wrote: Reason for CRM: Patient would like a phone call as to if the walker is going to be delivered to a certain location to be picked up. She stated that she does not have a phone and can only communicate through email.

## 2023-11-13 NOTE — Progress Notes (Signed)
 Complex Care Management Note Care Guide Note  11/13/2023 Name: Tamara Cline MRN: [id number] DOB: 24-Jul-1976   Complex Care Management Outreach Attempts: A third unsuccessful outreach was attempted today to offer the patient with information about available complex care management services.  Follow Up Plan:  No further outreach attempts will be made at this time. We have been unable to contact the patient to offer or enroll patient in complex care management services. Unable to leave message voicemail does not pickup. Emailed information to [email or web address] per email message left on voicemail.  Encounter Outcome:  No Answer  Brentt Fread [facility]  [facility] Guide Direct Dial: [phone]  Fax: [phone] Website: [email or web address]

## 2023-11-14 ENCOUNTER — Other Ambulatory Visit (INDEPENDENT_AMBULATORY_CARE_PROVIDER_SITE_OTHER): Payer: Self-pay

## 2023-11-14 NOTE — Telephone Encounter (Signed)
 Copied from CRM [id number]. Topic: Clinical - Prescription Issue >> Nov 14, 2023  2:30 PM Elle L wrote: Reason for CRM: The patient is requesting a call back regarding her prescription for her walker. The patient's call back number is [phone].  Please advise

## 2023-11-14 NOTE — Progress Notes (Signed)
 Alger Memos made a 3rd unsuccessful outreach call referral will be closed.  Gwenevere Ghazi  [facility]  [facility], [facility] Guide  Direct Dial: [phone]  Fax [phone]

## 2023-11-14 NOTE — Telephone Encounter (Signed)
 Spoke with pt yesterday in regards to walker. Made pt aware that I will work on trying to figure out what is going on. Made pt aware that I will try to have a definite answer for her when she calls back.Marland KitchenMarland KitchenPt is homeless and she doesn't have a phone to be reached by. Was in the process of doing the order for walker and was going to have it delivered here to the office, but was informed by provider that Erskine Squibb is working on it.

## 2023-11-15 ENCOUNTER — Telehealth (INDEPENDENT_AMBULATORY_CARE_PROVIDER_SITE_OTHER): Payer: Self-pay | Admitting: Primary Care

## 2023-11-15 NOTE — Telephone Encounter (Signed)
 Tamara Cline would have any updates on this. Patient has been calling [employer] about when her walker is suppose to be getting delivered.

## 2023-11-15 NOTE — Telephone Encounter (Signed)
 Pt has been calling [employer] about walker. Pt has called this afternoon and Kellen spoke with pt

## 2023-11-15 NOTE — Telephone Encounter (Signed)
 E2C2 called with pt on the phone in reference to pt receiving a walker. Pt was given information to where walker can be sent and also given information about transportation resources within her insurance.

## 2023-11-16 NOTE — Telephone Encounter (Signed)
 Per last note needed patient approval to send it to [facility], I have not sent any paperwork in due to th e address issue

## 2023-11-20 ENCOUNTER — Other Ambulatory Visit (INDEPENDENT_AMBULATORY_CARE_PROVIDER_SITE_OTHER): Payer: Self-pay

## 2023-11-20 ENCOUNTER — Telehealth (INDEPENDENT_AMBULATORY_CARE_PROVIDER_SITE_OTHER): Payer: Self-pay

## 2023-11-20 DIAGNOSIS — G8929 Other chronic pain: Secondary | ICD-10-CM

## 2023-11-20 NOTE — Telephone Encounter (Signed)
 Noted thank y'all

## 2023-11-20 NOTE — Telephone Encounter (Signed)
 Returned pt call and spoke to her husband/friend [facility] he stated he just left patient. Made Antonio aware that pt walker has been delivered today at [facility] and I provided the address. Per Antonio they are out of town at the moment but will be back in a couple of days and when pt gets back she will be by to get walker

## 2023-11-20 NOTE — Telephone Encounter (Signed)
 Noted. If pt calls the office I will make patient aware that walker has been delivered

## 2023-11-20 NOTE — Telephone Encounter (Signed)
 I have faxed rx and office notes to [facility] for pt walker first thing this morning. I reached out to [facility] and spoke with Maurie Southern and she stated that it has not gotten to the intake team as of yet but once it does I would be able to call back and get more information

## 2023-11-20 NOTE — Telephone Encounter (Signed)
 Received a call from Bambi Lever from [facility] in regards to the walker rx I sent this morning. Per Bambi Lever it will be delivered today. I made Bambi Lever aware that who ever is dropping off the walker will need to speak with Jerryl Morin or [place]. If pt reaches out to the office today I will make pt aware that worker will be delivered today.

## 2023-11-20 NOTE — Telephone Encounter (Signed)
 Copied from CRM [id number]. Topic: General - Other >> Nov 20, 2023  4:02 PM Rosaria Common wrote: Reason for CRM: Pt calling to check on the status of medical walker on wheels being sent to clinic for pick up. No callback number provided.

## 2023-11-20 NOTE — Telephone Encounter (Signed)
 The rollator has been delivered to [facility]

## 2023-11-20 NOTE — Telephone Encounter (Signed)
 I spoke to Tamara Cline/ [facility] and confirmed delivery address for walker is [facility]. He said they will plan to drop it off today before 5 pm. If that is not possible, they will drop it off tomorrow, 11/21/2023 between 9 am-4 pm. I instructed Tamara Cline to please have the technician give the walker to a [facility] staff member, not just leave it at the clinic.

## 2023-12-19 ENCOUNTER — Telehealth: Payer: Self-pay | Admitting: Primary Care

## 2023-12-19 NOTE — Telephone Encounter (Signed)
 Copied from CRM [id number]. Topic: General - Other >> Dec 19, 2023 12:55 PM Bridgette Campus T wrote: Reason for CRM: Patient calling to make sure the walker that was ordered for her is still there, she will be back in town after the 27th of this month and will need to pick it up-  [phone] or [phone], you can also email her- [email or web address]

## 2023-12-21 NOTE — Telephone Encounter (Signed)
 Please refer to telephone encounter 11/09/23. If walker is still there please reach out to pt

## 2023-12-21 NOTE — Telephone Encounter (Signed)
 Call to patient unable to reach and leave message .

## 2024-02-04 ENCOUNTER — Telehealth (INDEPENDENT_AMBULATORY_CARE_PROVIDER_SITE_OTHER): Payer: Self-pay | Admitting: Primary Care

## 2024-02-04 NOTE — Telephone Encounter (Signed)
 Called pt to confirm appt. Pt's phone is unavailable.

## 2024-02-05 ENCOUNTER — Telehealth (INDEPENDENT_AMBULATORY_CARE_PROVIDER_SITE_OTHER): Payer: Self-pay | Admitting: Primary Care

## 2024-02-05 ENCOUNTER — Ambulatory Visit (INDEPENDENT_AMBULATORY_CARE_PROVIDER_SITE_OTHER): Admitting: Primary Care

## 2024-02-05 NOTE — Telephone Encounter (Signed)
 Called pt to reschedule missed appt. Pt did not answer and could not LVM bc mailbox was full.

## 2024-02-07 NOTE — Telephone Encounter (Signed)
 The patient's rollator is still in my office for her to pick up

## 2024-02-23 ENCOUNTER — Emergency Department (HOSPITAL_COMMUNITY)
Admission: EM | Admit: 2024-02-23 | Discharge: 2024-02-23 | Disposition: A | Discharge status: Home / Self Care | Attending: Emergency Medicine | Admitting: Emergency Medicine

## 2024-02-23 ENCOUNTER — Emergency Department (HOSPITAL_COMMUNITY)

## 2024-02-23 DIAGNOSIS — R059 Cough, unspecified: Secondary | ICD-10-CM | POA: Diagnosis not present

## 2024-02-23 DIAGNOSIS — J45909 Unspecified asthma, uncomplicated: Secondary | ICD-10-CM | POA: Insufficient documentation

## 2024-02-23 DIAGNOSIS — R0789 Other chest pain: Secondary | ICD-10-CM | POA: Insufficient documentation

## 2024-02-23 DIAGNOSIS — R079 Chest pain, unspecified: Secondary | ICD-10-CM | POA: Diagnosis not present

## 2024-02-23 DIAGNOSIS — T148XXA Other injury of unspecified body region, initial encounter: Secondary | ICD-10-CM

## 2024-02-23 DIAGNOSIS — S29011A Strain of muscle and tendon of front wall of thorax, initial encounter: Secondary | ICD-10-CM | POA: Diagnosis not present

## 2024-02-23 LAB — BASIC METABOLIC PANEL WITH GFR
Anion gap: 7 (ref 5–15)
BUN: 12 mg/dL (ref 6–20)
CO2: 26 mmol/L (ref 22–32)
Calcium: 8.8 mg/dL — ABNORMAL LOW (ref 8.9–10.3)
Chloride: 106 mmol/L (ref 98–111)
Creatinine, Ser: 0.56 mg/dL (ref 0.44–1.00)
GFR, Estimated: >60 mL/min (ref >60)
Glucose, Bld: 101 mg/dL — ABNORMAL HIGH (ref 70–99)
Potassium: 3.8 mmol/L (ref 3.5–5.1)
Sodium: 139 mmol/L (ref 135–145)

## 2024-02-23 LAB — CBC
HCT: 43.3 % (ref 36.0–46.0)
Hemoglobin: 13.5 g/dL (ref 12.0–15.0)
MCH: 30 pg (ref 26.0–34.0)
MCHC: 31.2 g/dL (ref 30.0–36.0)
MCV: 96.2 fL (ref 80.0–100.0)
Platelets: 265 K/uL (ref 150–400)
RBC: 4.5 MIL/uL (ref 3.87–5.11)
RDW: 14.2 % (ref 11.5–15.5)
WBC: 7.9 K/uL (ref 4.0–10.5)
nRBC: 0 % (ref 0.0–0.2)

## 2024-02-23 LAB — TROPONIN I (HIGH SENSITIVITY)
Troponin I (High Sensitivity): 4 ng/L (ref <18)
Troponin I (High Sensitivity): 4 ng/L (ref <18)

## 2024-02-23 MED ORDER — ACETAMINOPHEN 500 MG PO TABS
1000.0000 mg | ORAL_TABLET | Freq: Once | ORAL | Status: AC
  Administered 2024-02-23: 1000 mg via ORAL
  Filled 2024-02-23: qty 2

## 2024-02-23 NOTE — ED Triage Notes (Signed)
 Patient complains of pain in chest, started this morning after she trying to get out of a low chair. States she thinks its a pulled muscle but wants to be checked to be sure. No cardiac history and on no bp meds. Patient states pain is across her chest and pain shoots across her chest, comes and goes. States it is worse on movement of her arms. Patient denies any N/V or sweating. Skin warm and dry to touch. Patient tearful at triage. Rates pain 9/10.

## 2024-02-23 NOTE — ED Notes (Addendum)
EKG given to [place].

## 2024-02-23 NOTE — ED Provider Notes (Signed)
 [facility] AT [facility] Provider Note   CSN: [id number] Arrival date & time: 02/23/24  1603     Patient presents with: Chest Pain   Tamara Cline is a 48 y.o. female with PMHx asthma who presents to ED concerned for chest pain. Symptoms started after patient got up from a short chair and thinks that symptoms are related to a muscle strain - but wants to make sure that their heart is okay. Chest pain started around 6AM this morning. States that pain is fine when sitting still. Pain is exacerbated when patient moves her arms. Denies recent fever, SOB, nausea, vomiting, diarrhea. Endorses mild and intermittent cough over the past couple of days.     Chest Pain      Prior to Admission medications   Medication Sig Start Date End Date Taking? Authorizing Provider  atorvastatin  (LIPITOR) 40 MG tablet Take 1 tablet (40 mg total) by mouth daily. Patient not taking: Reported on 11/05/2023 09/10/23   Celestia Rosaline SQUIBB, NP    Allergies: Patient has no known allergies.    Review of Systems  Cardiovascular:  Positive for chest pain.    Updated Vital Signs BP (!) 174/95 (BP Location: Right Arm)   Pulse 79   Temp 98.3 F (36.8 C) (Oral)   Resp (!) 21   SpO2 98%   Physical Exam Vitals and nursing note reviewed.  Constitutional:      General: She is not in acute distress.    Appearance: She is not ill-appearing or toxic-appearing.  HENT:     Head: Normocephalic and atraumatic.     Mouth/Throat:     Mouth: Mucous membranes are moist.     Pharynx: No oropharyngeal exudate or posterior oropharyngeal erythema.  Eyes:     General: No scleral icterus.       Right eye: No discharge.        Left eye: No discharge.     Conjunctiva/sclera: Conjunctivae normal.  Cardiovascular:     Rate and Rhythm: Normal rate and regular rhythm.     Pulses: Normal pulses.     Heart sounds: Normal heart sounds. No murmur heard. Pulmonary:     Effort: Pulmonary effort  is normal. No respiratory distress.     Breath sounds: Normal breath sounds. No wheezing, rhonchi or rales.  Chest:     Comments: Chest pain is reproducible to palpation Abdominal:     General: Bowel sounds are normal.     Palpations: Abdomen is soft.  Musculoskeletal:     Right lower leg: No edema.     Left lower leg: No edema.  Skin:    General: Skin is warm and dry.     Findings: No rash.  Neurological:     General: No focal deficit present.     Mental Status: She is alert and oriented to person, place, and time. Mental status is at baseline.  Psychiatric:        Mood and Affect: Mood normal.     (all labs ordered are listed, but only abnormal results are displayed) Labs Reviewed  BASIC METABOLIC PANEL WITH GFR - Abnormal; Notable for the following components:      Result Value   Glucose, Bld 101 (*)    Calcium  8.8 (*)    All other components within normal limits  CBC  TROPONIN I (HIGH SENSITIVITY)  TROPONIN I (HIGH SENSITIVITY)    EKG: EKG Interpretation Date/Time:  Saturday February 23 2024 16:10:50 EDT Ventricular  Rate:  81 PR Interval:  149 QRS Duration:  103 QT Interval:  389 QTC Calculation: 452 R Axis:   -27  Text Interpretation: Sinus rhythm Probable left atrial enlargement Borderline left axis deviation Low voltage, precordial leads Baseline wander in lead(s) III Confirmed by Francesca Fallow ([id number]) on 02/23/2024 5:51:32 PM  Radiology: [facility] Chest 2 View Result Date: 02/23/2024 CLINICAL DATA:  Chest pain. EXAM: CHEST - 2 VIEW COMPARISON:  02/20/2017.  CT, 05/04/2022. FINDINGS: Cardiac silhouette normal in size and configuration. No mediastinal or hilar masses. No convincing adenopathy. Lungs are clear allowing for the semi-erect positioning and significant overlying soft tissues. No convincing pleural effusion or pneumothorax. Skeletal structures are grossly intact. IMPRESSION: No active cardiopulmonary disease. Electronically Signed   By: Alm Parkins M.D.   On:  02/23/2024 17:54     Procedures   Medications Ordered in the ED  acetaminophen  (TYLENOL ) tablet 1,000 mg (1,000 mg Oral Given 02/23/24 1825)                                    Medical Decision Making Amount and/or Complexity of Data Reviewed Labs: ordered. Radiology: ordered.  Risk OTC drugs.   This patient presents to the ED for concern of chest pain, this involves an extensive number of treatment options, and is a complaint that carries with it a high risk of complications and morbidity.  The differential diagnosis includes acute coronary syndrome, congestive heart failure, pericarditis, pneumonia, pulmonary embolism, tension pneumothorax, esophageal rupture, aortic dissection, cardiac tamponade, musculoskeletal   Co morbidities that complicate the patient evaluation  Asthma   Additional history obtained:  Dr. Celestia PCP   Risk Stratification Score:  HEART Score: (low) 2 points d/t age and hx of HLD   Problem List / ED Course / Critical interventions / Medication management  Patient presented for chest pain.  Physical exam with chest wall tenderness to palpation.  Rest of physical exam reassuring.  Patient afebrile with stable vitals. I Ordered, and personally interpreted labs.  CBC without leukocytosis or anemia.  BMP reassuring.  Troponins within normal limits.  The patient was maintained on a cardiac monitor.  I personally viewed and interpreted the EKG/cardiac monitored which showed an underlying rhythm of: sinus rhythm. I ordered imaging studies including chest xray to assess for process contributing to patient's symptoms. I independently visualized and interpreted imaging which showed no acute cardiopulmonary disease. I agree with the radiologist interpretation. Patient stating that she is ready to go home. It appears the patient's pain is due to muscle strain.  Shared all results with patient.  Answered all questions.  Provided patient with Tylenol .  Patient  stating she is feeling better.  Patient understands to follow-up with PCP.  I have reviewed the patients home medicines and have made adjustments as needed The patient has been appropriately medically screened and/or stabilized in the ED. I have low suspicion for any other emergent medical condition which would require further screening, evaluation or treatment in the ED or require inpatient management. At time of discharge the patient is hemodynamically stable and in no acute distress. I have discussed work-up results and diagnosis with patient and answered all questions. Patient is agreeable with discharge plan. We discussed strict return precautions for returning to the emergency department and they verbalized understanding.     Social Determinants of Health:  none      Final diagnoses:  Atypical chest pain  Muscle strain    ED Discharge Orders     None          Hoy Nidia FALCON, [place] 02/23/24 1914    Francesca Elsie CROME, MD 02/23/24 2112

## 2024-02-23 NOTE — Discharge Instructions (Addendum)

## 2024-03-11 ENCOUNTER — Telehealth: Payer: Self-pay

## 2024-03-11 ENCOUNTER — Ambulatory Visit: Payer: Self-pay
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
## 2024-03-11 NOTE — Telephone Encounter (Signed)
 Copied from CRM [id number]. Topic: General - Other >> Mar 11, 2024  2:50 PM Wess RAMAN wrote: Reason for CRM: Patient will like to know if her walker with wheels is ready for pickup  Callback #: [phone]

## 2024-04-11 NOTE — Telephone Encounter (Signed)
 Parachute order initiated.

## 2024-07-07 ENCOUNTER — Encounter: Admitting: Family

## 2024-07-07 ENCOUNTER — Telehealth: Payer: Self-pay | Admitting: Primary Care

## 2024-07-07 ENCOUNTER — Ambulatory Visit: Payer: Self-pay

## 2024-07-07 ENCOUNTER — Telehealth (INDEPENDENT_AMBULATORY_CARE_PROVIDER_SITE_OTHER): Payer: Self-pay | Admitting: Primary Care

## 2024-07-07 NOTE — Telephone Encounter (Signed)
 Copied from CRM [id number]. Topic: Clinical - Order For Equipment >> Jul 07, 2024 12:22 PM Lonell PEDLAR wrote: Reason for CRM: patient is requesting order for a walker

## 2024-07-07 NOTE — Telephone Encounter (Signed)
 Patient called E2C2 about her appt, but was going to be more than 10 mins late. E2C2 inform patient that she will have to reschedule.
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
## 2024-07-07 NOTE — Progress Notes (Signed)
 Erroneous encounter-disregard

## 2024-07-08 NOTE — Telephone Encounter (Signed)
 Please reach out to pt and make aware that her walker has been ready for pick up since April 2025. Pt will need to go to [facility] and ask for Jane.

## 2024-07-08 NOTE — Telephone Encounter (Signed)
 Called pt to let her know. Pt is unavailable and could not LVM.

## 2024-07-28 ENCOUNTER — Telehealth (INDEPENDENT_AMBULATORY_CARE_PROVIDER_SITE_OTHER): Payer: Self-pay | Admitting: Primary Care

## 2024-07-28 NOTE — Telephone Encounter (Signed)
 Called pt to confirm appt.Pt did not answer and could not LVM.

## 2024-07-29 ENCOUNTER — Encounter (INDEPENDENT_AMBULATORY_CARE_PROVIDER_SITE_OTHER): Payer: Self-pay | Admitting: Primary Care

## 2024-07-29 ENCOUNTER — Ambulatory Visit (INDEPENDENT_AMBULATORY_CARE_PROVIDER_SITE_OTHER): Admitting: Primary Care

## 2024-07-29 VITALS — BP 139/84 | HR 75 | Resp 16 | Ht 65.0 in | Wt 343.0 lb

## 2024-07-29 DIAGNOSIS — M25561 Pain in right knee: Secondary | ICD-10-CM

## 2024-07-29 DIAGNOSIS — Z6841 Body Mass Index (BMI) 40.0 and over, adult: Secondary | ICD-10-CM | POA: Diagnosis not present

## 2024-07-29 DIAGNOSIS — K5900 Constipation, unspecified: Secondary | ICD-10-CM

## 2024-07-29 DIAGNOSIS — M25562 Pain in left knee: Secondary | ICD-10-CM

## 2024-07-29 DIAGNOSIS — G8929 Other chronic pain: Secondary | ICD-10-CM

## 2024-07-29 DIAGNOSIS — R051 Acute cough: Secondary | ICD-10-CM | POA: Diagnosis not present

## 2024-07-29 DIAGNOSIS — M545 Low back pain, unspecified: Secondary | ICD-10-CM | POA: Diagnosis not present

## 2024-07-29 DIAGNOSIS — E662 Morbid (severe) obesity with alveolar hypoventilation: Secondary | ICD-10-CM | POA: Diagnosis not present

## 2024-07-29 DIAGNOSIS — R0602 Shortness of breath: Secondary | ICD-10-CM | POA: Diagnosis not present

## 2024-07-29 DIAGNOSIS — F4323 Adjustment disorder with mixed anxiety and depressed mood: Secondary | ICD-10-CM | POA: Diagnosis not present

## 2024-07-29 MED ORDER — BENZONATATE 200 MG PO CAPS: 200.0000 mg | ORAL_CAPSULE | Freq: Two times a day (BID) | ORAL | 0 refills | Status: DC | PRN

## 2024-07-29 MED ORDER — KETOROLAC TROMETHAMINE 60 MG/2ML IM SOLN
60.0000 mg | Freq: Once | INTRAMUSCULAR | Status: AC
  Administered 2024-07-29: 60 mg via INTRAMUSCULAR

## 2024-07-29 MED ORDER — ALBUTEROL SULFATE HFA 108 (90 BASE) MCG/ACT IN AERS: 2.0000 | INHALATION_SPRAY | Freq: Four times a day (QID) | RESPIRATORY_TRACT | 2 refills | Status: DC | PRN

## 2024-07-29 MED ORDER — BENZONATATE 200 MG PO CAPS: 200.0000 mg | ORAL_CAPSULE | Freq: Two times a day (BID) | ORAL | 0 refills | Status: AC | PRN

## 2024-07-29 MED ORDER — ALBUTEROL SULFATE HFA 108 (90 BASE) MCG/ACT IN AERS: 2.0000 | INHALATION_SPRAY | Freq: Four times a day (QID) | RESPIRATORY_TRACT | 2 refills | Status: AC | PRN

## 2024-07-29 NOTE — Progress Notes (Signed)
 " [facility]  Tamara Cline, is a 48 y.o. female  RDW:[id number]  FMW:[id number]  DOB - Sep 02, 1975  Chief Complaint  Patient presents with   Constipation   Arthritis    Knee and back  Pt is requesting a shower chair    Cough    Been taking otc and it has not stopped       Subjective:   Tamara Cline is a 48 y.o. female here today for an acute visit. C/O cough using OTC with no relief. Unstable gait secondary to arthritis back and knees/Body mass index is 57.08 kg/m. Requesting shower chair. Also, has constipation increase water and fiber.  HPI  No problems updated.  Comprehensive ROS Pertinent positive and negative noted in HPI   Allergies[1]  Past Medical History:  Diagnosis Date   Asthma     Medications Ordered Prior to Encounter[2] Health Maintenance  Topic Date Due   Pneumococcal Vaccine for age over 37 (1 of 2 - PCV) Never done   Hepatitis B Vaccine (1 of 3 - 19+ 3-dose series) Never done   Pap with HPV screening  Never done   Breast Cancer Screening  Never done   Colon Cancer Screening  Never done   Zoster (Shingles) Vaccine (1 of 2) Never done   Flu Shot  Never done   COVID-19 Vaccine (1 - 2025-26 season) Never done   DTaP/Tdap/Td vaccine (1 - Tdap) 09/04/2024*   Hepatitis C Screening  Completed   HIV Screening  Completed   HPV Vaccine  Aged Out   Meningitis B Vaccine  Aged Out  *Topic was postponed. The date shown is not the original due date.    Objective:   Vitals:   07/29/24 1005  BP: 139/84  Pulse: 75  Resp: 16  SpO2: 100%  Weight: (!) 343 lb (155.6 kg)  Height: 5' 5 (1.651 m)   BP Readings from Last 3 Encounters:  07/29/24 139/84  02/23/24 (!) 174/95  11/05/23 136/88      Physical Exam Vitals reviewed.  Constitutional:      Appearance: Normal appearance. She is obese.     Comments: morbid  HENT:     Head: Normocephalic.     Right Ear: Tympanic membrane, ear canal and external ear normal.      Left Ear: Tympanic membrane, ear canal and external ear normal.     Nose: Congestion present.     Mouth/Throat:     Mouth: Mucous membranes are moist.  Eyes:     Extraocular Movements: Extraocular movements intact.     Pupils: Pupils are equal, round, and reactive to light.  Cardiovascular:     Rate and Rhythm: Normal rate and regular rhythm.  Pulmonary:     Effort: Pulmonary effort is normal.     Breath sounds: Normal breath sounds.  Abdominal:     General: Bowel sounds are normal.     Palpations: Abdomen is soft.  Musculoskeletal:        General: Normal range of motion.     Cervical back: Normal range of motion and neck supple.  Skin:    General: Skin is warm and dry.  Neurological:     Mental Status: She is alert and oriented to person, place, and time.  Psychiatric:        Mood and Affect: Mood normal.        Behavior: Behavior normal.        Thought Content: Thought content normal.  Assessment & Plan   Tamara Cline was seen today for constipation, arthritis and cough.  Diagnoses and all orders for this visit:  Acute cough -     Discontinue: benzonatate  (TESSALON ) 200 MG capsule; Take 1 capsule (200 mg total) by mouth 2 (two) times daily as needed for cough. -     benzonatate  (TESSALON ) 200 MG capsule; Take 1 capsule (200 mg total) by mouth 2 (two) times daily as needed for cough.  Shortness of breath 2/2 Morbid obesity (HCC) -     Discontinue: albuterol  (VENTOLIN  HFA) 108 (90 Base) MCG/ACT inhaler; Inhale 2 puffs into the lungs every 6 (six) hours as needed for wheezing or shortness of breath. -     Discontinue: albuterol  (VENTOLIN  HFA) 108 (90 Base) MCG/ACT inhaler; Inhale 2 puffs into the lungs every 6 (six) hours as needed for wheezing or shortness of breath. -     albuterol  (VENTOLIN  HFA) 108 (90 Base) MCG/ACT inhaler; Inhale 2 puffs into the lungs every 6 (six) hours as needed for wheezing or shortness of breath.  Chronic pain of both knees 2/2  Morbid  obesity  -     Ambulatory referral to Physical Therapy -     ketorolac  (TORADOL ) injection 60 mg  Chronic bilateral low back pain without sciatica 2/2  Morbid obesity  -     Ambulatory referral to Physical Therapy -     ketorolac  (TORADOL ) injection 60 mg  Morbid obesity with alveolar hypoventilation (HCC) Body mass index is 57.08 kg/m.   Adjustment disorder with mixed anxiety and depressed mood  Flowsheet Row Office Visit from 07/29/2024 in [facility]  PHQ-9 Total Score 17   Referral to therapy   Patient have been counseled extensively about nutrition and exercise. Other issues discussed during this visit include: low cholesterol diet, weight control and daily exercise, foot care, annual eye examinations at Ophthalmology, importance of adherence with medications and regular follow-up. We also discussed long term complications of uncontrolled diabetes and hypertension.   No follow-ups on file.  The patient was given clear instructions to go to ER or return to medical center if symptoms don't improve, worsen or new problems develop. The patient verbalized understanding. The patient was told to call to get lab results if they haven't heard anything in the next week.   This note has been created with Education officer, environmental. Any transcriptional errors are unintentional.   Tamara SHAUNNA Bohr, NP 07/29/2024, 10:59 AM       [1] No Known Allergies [2] No current outpatient medications on file prior to visit.   No current facility-administered medications on file prior to visit.  "

## 2024-08-19 ENCOUNTER — Telehealth (INDEPENDENT_AMBULATORY_CARE_PROVIDER_SITE_OTHER): Payer: Self-pay | Admitting: Primary Care

## 2024-08-19 NOTE — Telephone Encounter (Signed)
 Called pt to confirm appt.Pt did not answer and could not LVM.

## 2024-08-25 ENCOUNTER — Telehealth: Payer: Self-pay | Admitting: Primary Care

## 2024-08-25 NOTE — Telephone Encounter (Signed)
 08/25/24 Called patient to confirm address and appointment phone not working

## 2024-08-27 ENCOUNTER — Ambulatory Visit (INDEPENDENT_AMBULATORY_CARE_PROVIDER_SITE_OTHER): Payer: Self-pay | Admitting: Primary Care

## 2024-09-03 ENCOUNTER — Ambulatory Visit (INDEPENDENT_AMBULATORY_CARE_PROVIDER_SITE_OTHER): Admitting: Primary Care

## 2024-09-03 ENCOUNTER — Encounter (INDEPENDENT_AMBULATORY_CARE_PROVIDER_SITE_OTHER): Payer: Self-pay | Admitting: Primary Care

## 2024-09-03 ENCOUNTER — Telehealth (INDEPENDENT_AMBULATORY_CARE_PROVIDER_SITE_OTHER): Payer: Self-pay | Admitting: Primary Care

## 2024-09-03 NOTE — Telephone Encounter (Signed)
 Called pt to arrange transportation. Pt is aware that someone will be picking her up to come to appt.

## 2024-09-03 NOTE — Progress Notes (Unsigned)
" °  [facility]  Paw Karstens, is a 49 y.o. female  RDW:[id number]  FMW:[id number]  DOB - 13-Sep-1975  Chief Complaint  Patient presents with   Blood Pressure Check       Subjective:   Tamara Cline is a 50 y.o. female here today for a follow up visit. Patient has No headache, No chest pain, No abdominal pain - No Nausea, No new weakness tingling or numbness, No Cough - shortness of breath HPI  No problems updated.  Comprehensive ROS Pertinent positive and negative noted in HPI   Allergies[1]  Past Medical History:  Diagnosis Date   Asthma     Medications Ordered Prior to Encounter[2] Health Maintenance  Topic Date Due   Pneumococcal Vaccine for age over 19 (1 of 2 - PCV) Never done   Hepatitis B Vaccine (1 of 3 - 19+ 3-dose series) Never done   Pap with HPV screening  Never done   Breast Cancer Screening  Never done   Colon Cancer Screening  Never done   Zoster (Shingles) Vaccine (1 of 2) Never done   Flu Shot  Never done   COVID-19 Vaccine (1 - 2025-26 season) Never done   DTaP/Tdap/Td vaccine (1 - Tdap) 09/04/2024*   HPV Vaccine (No Doses Required) Completed   Hepatitis C Screening  Completed   HIV Screening  Completed   Meningitis B Vaccine  Aged Out  *Topic was postponed. The date shown is not the original due date.    Objective:  There were no vitals filed for this visit. 143/95 BP Readings from Last 3 Encounters:  09/03/24 (!) 143/95  07/29/24 139/84  02/23/24 (!) 174/95      Physical Exam  {Labs (Optional):23779}  Assessment & Plan  There are no diagnoses linked to this encounter.   Patient have been counseled extensively about nutrition and exercise. Other issues discussed during this visit include: low cholesterol diet, weight control and daily exercise, foot care, annual eye examinations at Ophthalmology, importance of adherence with medications and regular follow-up. We also discussed long term complications of uncontrolled  diabetes and hypertension.   No follow-ups on file.  The patient was given clear instructions to go to ER or return to medical center if symptoms don't improve, worsen or new problems develop. The patient verbalized understanding. The patient was told to call to get lab results if they haven't heard anything in the next week.   This note has been created with Education officer, environmental. Any transcriptional errors are unintentional.   Tamara SHAUNNA Bohr, NP 09/03/2024, 12:05 PM    [1] No Known Allergies [2]  Current Outpatient Medications on File Prior to Visit  Medication Sig Dispense Refill   albuterol  (VENTOLIN  HFA) 108 (90 Base) MCG/ACT inhaler Inhale 2 puffs into the lungs every 6 (six) hours as needed for wheezing or shortness of breath. 18 g 2   benzonatate  (TESSALON ) 200 MG capsule Take 1 capsule (200 mg total) by mouth 2 (two) times daily as needed for cough. 20 capsule 0   No current facility-administered medications on file prior to visit.   "

## 2024-09-17 ENCOUNTER — Ambulatory Visit (INDEPENDENT_AMBULATORY_CARE_PROVIDER_SITE_OTHER): Payer: Self-pay
